# Patient Record
Sex: Female | Born: 1965
Health system: Southern US, Community
[De-identification: ages and names within clinical notes are randomized; demographics above are authoritative.]

## PROBLEM LIST (undated history)

## (undated) DIAGNOSIS — K219 Gastro-esophageal reflux disease without esophagitis: Secondary | ICD-10-CM

## (undated) DIAGNOSIS — T7840XA Allergy, unspecified, initial encounter: Secondary | ICD-10-CM

## (undated) DIAGNOSIS — E119 Type 2 diabetes mellitus without complications: Secondary | ICD-10-CM

## (undated) DIAGNOSIS — IMO0002 Reserved for concepts with insufficient information to code with codable children: Secondary | ICD-10-CM

## (undated) DIAGNOSIS — M1990 Unspecified osteoarthritis, unspecified site: Secondary | ICD-10-CM

## (undated) DIAGNOSIS — S92909A Unspecified fracture of unspecified foot, initial encounter for closed fracture: Secondary | ICD-10-CM

## (undated) DIAGNOSIS — F419 Anxiety disorder, unspecified: Secondary | ICD-10-CM

## (undated) DIAGNOSIS — M549 Dorsalgia, unspecified: Secondary | ICD-10-CM

## (undated) HISTORY — PX: CERVICAL CERCLAGE: SHX1329

## (undated) HISTORY — DX: Type 2 diabetes mellitus without complications: E11.9

## (undated) HISTORY — DX: Unspecified osteoarthritis, unspecified site: M19.90

## (undated) HISTORY — DX: Allergy, unspecified, initial encounter: T78.40XA

## (undated) HISTORY — PX: UPPER GASTROINTESTINAL ENDOSCOPY: SHX188

## (undated) HISTORY — DX: Dorsalgia, unspecified: M54.9

## (undated) HISTORY — DX: Reserved for concepts with insufficient information to code with codable children: IMO0002

## (undated) HISTORY — DX: Anxiety disorder, unspecified: F41.9

## (undated) HISTORY — DX: Unspecified fracture of unspecified foot, initial encounter for closed fracture: S92.909A

## (undated) HISTORY — DX: Gastro-esophageal reflux disease without esophagitis: K21.9

---

## 1999-10-13 ENCOUNTER — Inpatient Hospital Stay (HOSPITAL_COMMUNITY): Admission: AD | Admit: 1999-10-13 | Discharge: 1999-10-13 | Payer: Self-pay | Admitting: *Deleted

## 2000-01-05 ENCOUNTER — Inpatient Hospital Stay (HOSPITAL_COMMUNITY): Admission: AD | Admit: 2000-01-05 | Discharge: 2000-01-05 | Payer: Self-pay | Admitting: *Deleted

## 2000-09-28 ENCOUNTER — Other Ambulatory Visit: Admission: RE | Admit: 2000-09-28 | Discharge: 2000-09-28 | Payer: Self-pay | Admitting: Obstetrics and Gynecology

## 2000-11-23 ENCOUNTER — Ambulatory Visit (HOSPITAL_COMMUNITY): Admission: RE | Admit: 2000-11-23 | Discharge: 2000-11-23 | Payer: Self-pay | Admitting: Obstetrics and Gynecology

## 2000-11-23 ENCOUNTER — Encounter: Payer: Self-pay | Admitting: Obstetrics and Gynecology

## 2000-12-19 ENCOUNTER — Inpatient Hospital Stay (HOSPITAL_COMMUNITY): Admission: AD | Admit: 2000-12-19 | Discharge: 2000-12-21 | Payer: Self-pay | Admitting: Obstetrics and Gynecology

## 2000-12-25 ENCOUNTER — Inpatient Hospital Stay (HOSPITAL_COMMUNITY): Admission: AD | Admit: 2000-12-25 | Discharge: 2000-12-30 | Payer: Self-pay | Admitting: Obstetrics and Gynecology

## 2000-12-25 ENCOUNTER — Encounter (INDEPENDENT_AMBULATORY_CARE_PROVIDER_SITE_OTHER): Payer: Self-pay

## 2000-12-25 ENCOUNTER — Encounter: Payer: Self-pay | Admitting: Obstetrics and Gynecology

## 2001-08-16 ENCOUNTER — Other Ambulatory Visit: Admission: RE | Admit: 2001-08-16 | Discharge: 2001-08-16 | Payer: Self-pay | Admitting: Obstetrics and Gynecology

## 2001-08-30 ENCOUNTER — Ambulatory Visit (HOSPITAL_COMMUNITY): Admission: RE | Admit: 2001-08-30 | Discharge: 2001-08-30 | Payer: Self-pay | Admitting: Obstetrics and Gynecology

## 2001-09-17 ENCOUNTER — Encounter (INDEPENDENT_AMBULATORY_CARE_PROVIDER_SITE_OTHER): Payer: Self-pay

## 2001-09-17 ENCOUNTER — Encounter: Payer: Self-pay | Admitting: Obstetrics and Gynecology

## 2001-09-17 ENCOUNTER — Inpatient Hospital Stay (HOSPITAL_COMMUNITY): Admission: RE | Admit: 2001-09-17 | Discharge: 2001-09-19 | Payer: Self-pay | Admitting: Obstetrics and Gynecology

## 2004-06-22 ENCOUNTER — Ambulatory Visit (HOSPITAL_COMMUNITY): Admission: RE | Admit: 2004-06-22 | Discharge: 2004-06-22 | Payer: Self-pay | Admitting: Obstetrics and Gynecology

## 2004-07-05 ENCOUNTER — Other Ambulatory Visit: Admission: RE | Admit: 2004-07-05 | Discharge: 2004-07-05 | Payer: Self-pay | Admitting: Obstetrics and Gynecology

## 2004-07-07 ENCOUNTER — Observation Stay (HOSPITAL_COMMUNITY): Admission: AD | Admit: 2004-07-07 | Discharge: 2004-07-08 | Payer: Self-pay | Admitting: Obstetrics and Gynecology

## 2004-07-26 ENCOUNTER — Inpatient Hospital Stay (HOSPITAL_COMMUNITY): Admission: AD | Admit: 2004-07-26 | Discharge: 2004-07-26 | Payer: Self-pay | Admitting: Obstetrics and Gynecology

## 2004-11-09 ENCOUNTER — Inpatient Hospital Stay (HOSPITAL_COMMUNITY): Admission: AD | Admit: 2004-11-09 | Discharge: 2004-11-11 | Payer: Self-pay | Admitting: Obstetrics and Gynecology

## 2004-11-09 ENCOUNTER — Encounter (INDEPENDENT_AMBULATORY_CARE_PROVIDER_SITE_OTHER): Payer: Self-pay | Admitting: Specialist

## 2005-11-06 ENCOUNTER — Emergency Department (HOSPITAL_COMMUNITY): Admission: EM | Admit: 2005-11-06 | Discharge: 2005-11-06 | Payer: Self-pay | Admitting: Emergency Medicine

## 2005-12-05 ENCOUNTER — Other Ambulatory Visit: Admission: RE | Admit: 2005-12-05 | Discharge: 2005-12-05 | Payer: Self-pay | Admitting: Obstetrics and Gynecology

## 2007-11-27 ENCOUNTER — Emergency Department (HOSPITAL_COMMUNITY): Admission: EM | Admit: 2007-11-27 | Discharge: 2007-11-27 | Payer: Self-pay | Admitting: Family Medicine

## 2008-08-03 ENCOUNTER — Emergency Department (HOSPITAL_COMMUNITY): Admission: EM | Admit: 2008-08-03 | Discharge: 2008-08-03 | Payer: Self-pay | Admitting: Family Medicine

## 2009-01-08 ENCOUNTER — Emergency Department (HOSPITAL_COMMUNITY): Admission: EM | Admit: 2009-01-08 | Discharge: 2009-01-08 | Payer: Self-pay | Admitting: Emergency Medicine

## 2009-01-12 ENCOUNTER — Ambulatory Visit (HOSPITAL_COMMUNITY): Admission: RE | Admit: 2009-01-12 | Discharge: 2009-01-12 | Payer: Self-pay | Admitting: Obstetrics

## 2009-05-06 ENCOUNTER — Emergency Department (HOSPITAL_COMMUNITY): Admission: EM | Admit: 2009-05-06 | Discharge: 2009-05-06 | Payer: Self-pay | Admitting: Emergency Medicine

## 2009-06-03 ENCOUNTER — Encounter: Admission: RE | Admit: 2009-06-03 | Discharge: 2009-06-03 | Payer: Self-pay | Admitting: Internal Medicine

## 2010-01-14 ENCOUNTER — Emergency Department (HOSPITAL_COMMUNITY): Admission: EM | Admit: 2010-01-14 | Discharge: 2010-01-14 | Payer: Self-pay | Admitting: Emergency Medicine

## 2010-03-03 ENCOUNTER — Ambulatory Visit (HOSPITAL_COMMUNITY): Admission: RE | Admit: 2010-03-03 | Discharge: 2010-03-03 | Payer: Self-pay | Admitting: Obstetrics

## 2010-12-15 ENCOUNTER — Emergency Department (HOSPITAL_COMMUNITY)
Admission: EM | Admit: 2010-12-15 | Discharge: 2010-12-15 | Payer: Self-pay | Source: Home / Self Care | Admitting: Emergency Medicine

## 2010-12-15 ENCOUNTER — Emergency Department (HOSPITAL_COMMUNITY)
Admission: EM | Admit: 2010-12-15 | Discharge: 2010-12-15 | Payer: Self-pay | Source: Home / Self Care | Admitting: Family Medicine

## 2011-04-21 NOTE — H&P (Signed)
[facility]  Patient:    Amy [name], Amy [name] Visit Number: [id number] MRN: [id number]          Service Type: Attending:  Alvino Chapel, M.D. Dictated by:   Alvino Chapel, M.D. Adm. Date:  08/30/01                           History and Physical  HISTORY OF PRESENT ILLNESS:   The patient is a 45 year old G5, P2-1-1-2, who is coming in at 15-2/[redacted] weeks gestation with a history of incompetent cervix for a cervical cerclage to be placed electively early in pregnancy.  Pregnancy otherwise is complicated by sickle cell trait in the father of the baby and reflux disease, which is being treated with Pepcid.  PRENATAL LABS:                A positive, antibody negative, sickle negative, RPR nonreactive, rubella immune, hepatitis B surface antigen negative, HIV negative, GC negative, Chlamydia negative.  PAST OBSTETRICAL HISTORY:     In 1988, the patient had a term pregnancy at 7 pounds 1 ounce with bed rest and terbutaline required.  In 1990, she had a term delivery at 7 pounds 6 ounces with no prenatal care.  In 1995, she had an elective abortion.  In January 2002, the patient had a 23-weeks gestation which delivered vaginally, 1 pound 8.5 ounces, and expired after two days of life.  PAST GYNECOLOGICAL HISTORY:   None significant.  PAST MEDICAL HISTORY:         History of reflux disease and hiatal hernia.  PAST SURGICAL HISTORY:        None.  PHYSICAL EXAMINATION:  VITAL SIGNS:                  The patient is afebrile, with stable vital signs on admission.  Weight is 200 pounds even.  Blood pressure 118/80.  ABDOMEN:                      Fundal height is consistent with [redacted] weeks gestation.  Fetal heart rate was approximately 140 prior to procedure.  CERVIX:                       The cervix was long and closed to palpation.  PLAN:  The patient was counseled as to the risks and benefits of the procedure, including bleeding, infection, and  possible rupture of membrane, however, given her previous history, it is felt that this is a necessary course to avoid preterm delivery.  She understands this and wishes to proceed with surgery. Dictated by:   Alvino Chapel, M.D. Attending:  Alvino Chapel, M.D. DD:  08/29/01 TD:  08/29/01 Job: [id number]

## 2011-04-21 NOTE — Discharge Summary (Signed)
[facility]  Patient:    Amy [name], Amy [name]                     MRN: [id number] Adm. Date:  [id number] Disc. Date: 12/30/00 Attending:  Michaele Offer                           Discharge Summary  HISTORY OF PRESENT ILLNESS:   [age]-year-old black female para 2-0-1-2, gravida 4, [facility] Apr 24, 2001 by ultrasound admitted with a cervix 8 cm dilated and bloody vaginal discharge.  Vaginal ultrasound on September 17, 2000. Crown-rump length 2.0 cm, 8 weeks 5 days, [facility] Apr 24, 2001.  Blood group and type A positive with a negative antibody.  Sickle cell negative.  VDRL negative.  Rubella immune.  Hepatitis B surface antigen negative.  HIV negative.  GC and Chlamydia negative.  Triple screen normal.  The patient was admitted on December 19, 2000 with cervical dilatation.  She was placed on magnesium sulfate and did well.  She was discharged December 21, 2000.  On December 24, 2000, the patient noted some contractions and bloody discharge. She came to our office on day of admission and the cervix was 8 cm dilated. Membranes were intact even though the fern was slightly positive.  PAST MEDICAL HISTORY:         No known drug allergies.  No operations.  Usual childhood diseases.  She does have a hiatus hernia and gastroesophageal reflux disorder.  FAMILY HISTORY:               Mother has diabetes.  SOCIAL HISTORY:               Alcohol, tobacco, and drugs none.  OBSTETRIC HISTORY:            In March 1988, the patient took terbutaline during the pregnancy and delivered a 7 pounds 1 ounce female.  In February 1990, she had a 7-pound-6-ounce female, no problems.  Her labors were 1 and 1-1/2 hours, respectively.  In 1995, she had an early abortion.  PHYSICAL EXAMINATION:         Blood pressure was 120/72, pulse was 84, temperature was 99.  The abdomen was soft.  Fundal height 24 cm.  Fetal heart tones were normal.  Speculum exam by Dr. Jackelyn Knife showed a  bloody purulent discharge, (+/-) fern, cervix 8 cm with bulging bag of waters.  Ultrasound showed a vertex presentation.  PIH labs were normal.  IMPRESSION:                   1. Intrauterine pregnancy at 23 weeks.                               2. Premature cervical dilatation.  HOSPITAL COURSE:              The patient was placed on IV Unasyn and magnesium sulfate, bed rest in the Trendelenburg position, and she was given intramuscular betamethasone.  She and her partner wanted to try everything to save the baby.  I spoke to the neonatal physician, Dr. Mikle Bosworth and she recommended proceeding with the betamethasone.  On December 26, 2000, the patient complained of increased pressure during the night but exam confirmed that the cervix was 8 cm.  A Foley catheter was placed.  On December 27, 2000, the patient  complained that she felt a little bit wet in her vulvar area. Vital signs were okay.  The patient was instructed by Dr. Senaida Ores about the implications of a 23- to 24-week delivery in terms of fetal status.  Patient desired all interventions for the fetus including C-section for breech.  On December 28, 2000, the patient was leaking clear fluid but the bag of waters was bulging.  She subsequently on December 28, 2000 had bloody material from her vagina and she also began to have regular cramps, the cervix was fully dilated and the vertex was at +1 station.  Magnesium sulfate was discontinued. Artificial rupture of the membranes produced yellow-clear fluid.  She delivered spontaneously vertex over an intact perineum by Dr. Ambrose Mantle a living female infant 1 pound 8-1/2 ounces, 691 g, with Apgars of 1 at one minute, 3 at five minutes, and 5 at ten minutes.  Dr. Alison Murray was present for the delivery. The placenta had a formed blood clot on one margin.  Placenta was removed. Some membranes were removed with the ring forceps.  Blood loss was about 100 cc.  Postpartum, the patient did well except for a  low-grade temperature. She was treated with Augmentin postpartum.  The babys condition is quite guarded and the patient is discharged on the second postpartum day.  Initial hemoglobin 12.6, hematocrit 36.8, white count 10,400, platelet count 227,000. Followup hemoglobin 12.5, hematocrit 37.2, white count 12,200.  RPR was nonreactive.  Ultrasound report done while the patient was in the hospital showed a mean gestational age of [redacted] weeks and 2 days.  Estimated fetal weight was at the 77th percentile for a 23-week gestation.  There was no measurable cervix length and the cervix was thought to be approximately 4 cm dilated. The membranes could be seen bulging into the cervix.  No free fluid was seen in the cervical canal and amniotic fluid was appropriate.  FINAL DIAGNOSES:              1. Intrauterine pregnancy 23+ weeks,                                  delivered vertex.                               2. Premature labor versus incompetent cervix.                               3. Partial abruption of the placenta.                               4. Chorioamnionitis.  OPERATION:                    Spontaneous delivery vertex.  FINAL CONDITION:              Improved.  INSTRUCTIONS:                 Regular diet.  No vaginal entrance.  No heavy lifting or strenuous activity.  Report any fever above 100.4 degrees.  Report any extremely heavy bleeding.  Report any unusual problems.  FOLLOWUP:                     The patient is  advised to return to the office in four weeks for followup examination. DD:  12/30/00 TD:  12/30/00 Job: [id number]

## 2011-04-21 NOTE — Discharge Summary (Signed)
[name], [name]                        ACCOUNT NO.:  [id number]   MEDICAL RECORD NO.:  [id number]                   PATIENT TYPE:  OBV   LOCATION:  9374                                 FACILITY:  [facility]   PHYSICIAN:  Huel Cote, M.D.              DATE OF BIRTH:  1966/10/18   DATE OF ADMISSION:  07/07/2004  DATE OF DISCHARGE:  07/08/2004                                 DISCHARGE SUMMARY   DISCHARGE DIAGNOSES:  1. Preterm pregnancy at 20+ weeks.  2. Incompetent cervix.  3. Prior second trimester losses x2.  4. Status post McDonald cerclage placement x2.   DISCHARGE MEDICATIONS:  1. Augmentin 500 mg p.o. b.i.d. x7 days.  2. Flagyl 500 mg p.o. twice daily for an additional 4 days.   DISCHARGE FOLLOW-UP:  The patient is to follow up in the office in  approximately 1 week for cervical evaluation by ultrasound and exam.  The  patient will be on bedrest and pelvic rest with bathroom privileges only at  home.   HOSPITAL COURSE:  The patient is a 45 year old G6 P2-1-2-2 who was admitted  to undergo cervical cerclage placement for history of incompetent cervix and  findings of cervical shortening and dilation on her recent ultrasound.  The  patient first presented for prenatal care late at 18 weeks; therefore, her  dating is by an 18-week ultrasound.  She was admitted and underwent a  McDonald cerclage placement x2 without difficulty.  At the conclusion of the  procedure the cervix was approximately 2 cm long and was closed with two  sutures in place.  She was admitted for overnight observation and had an  uneventful course.  She had no bleeding or leakage of fluid and good fetal  movement.  She was continued on her Unasyn as she has tested positive in her  group B strep status prior to and with this pregnancy, and was discharged  home on Augmentin.  The patient will continue bedrest and pelvic rest and  follow up within the week in the office.                   Huel Cote, M.D.    KR/MEDQ  D:  07/31/2004  T:  08/01/2004  Job:  [id number]

## 2011-04-21 NOTE — H&P (Signed)
Amy [name], Amy [name]                        ACCOUNT NO.:  [id number]   MEDICAL RECORD NO.:  [id number]                   PATIENT TYPE:  AMB   LOCATION:  SDC                                  FACILITY:  [facility]   PHYSICIAN:  Huel Cote, M.D.              DATE OF BIRTH:  09/23/1966   DATE OF ADMISSION:  07/07/2004  DATE OF DISCHARGE:                                HISTORY & PHYSICAL   The patient is a 45 year old G6, P2-1-2-2, who is admitted to undergo a  cervical cerclage placement for a history of incompetent cervix and a  finding of cervical shortening on her most recent ultrasound to 1.9 cm with  some funneling.  The patient first presented for prenatal care at  approximately 18 weeks' gestation when she called to report that she had  missed her period and had a positive pregnancy test, however was already  feeling fetal movement.  She was seen on June 23, 2004, by Malachi Pro. Ambrose Mantle,  M.D., who evaluated her and felt that her cervix felt long and closed, and  ultrasound performed at that time indeed showed the cervix to be 3.5 cm  long.  The patient was given all options, including expectant management  with p.o. antibiotic therapy as well as a cerclage placement and close  monitoring.  The patient at that time declined any cerclage and was  scheduled for follow-up within two weeks.  When she followed up again on  July 05, 2004, her cervical length was repeated and found to be diminished  to 1.9 cm.  On exam the cervix felt closed at the internal os with external  os open and the patient was again counseled for a high risk of second  trimester miscarriage.  The patient had been placed prophylactically on  penicillin for a positive group B strep and given Flagyl for a bacterial  vaginosis infection.  Prenatal labs have been performed and are pending.   PAST MEDICAL HISTORY:  Reflux disease.   PAST SURGICAL HISTORY:  History of an elective abortion and history of a  cerclage  placement with her last pregnancy.   PAST OBSTETRICAL HISTORY:  In 1988 the patient had term vaginal delivery of  a 7 pound 1 ounce female infant.  She did have preterm labor and bed rest  with that pregnancy.  In 1990 she had a 7 pound 6 ounce infant at term with  no prenatal care.  In 1995 she had an elective abortion.  In 2002 she had a  delivery of a 23-week fetus, 1 pound 8 ounces, which expired after 2 days of  age.  In 2002 later that same year the patient became pregnant and had a  cerclage placed at approximately 15 weeks.  She then returned to the  hospital in approximately two weeks with leakage of fluid consistent with  PROM and severe chorioamnionitis and went on to deliver.   PAST  GYNECOLOGIC HISTORY:  History as stated previously, consistent with a  probable incompetent cervix.   PHYSICAL EXAMINATION:  VITAL SIGNS:  The patient's blood pressure is 115/60,  weight is 204 pounds.  Fundal height is at the umbilicus consistent with a  20-week gestation.  ABDOMEN:  Soft and nontender.  CARDIAC:  Regular rate and rhythm.  CHEST:  Lungs are clear.  PELVIC:  Cervical exam, as stated, is long and closed at the internal os.  Her pelvic exam is also positive on wet prep for a bacterial vaginosis  infection, for which she has been placed on Flagyl.  MONITORING:  Fetal heart rate is good at approximately 150.  The patient is  having ultrasound with normal fetal anatomy noted except for a fetal  echogenic intracardiac focus.  However, no other findings were of note.   The patient was counseled on the risks and benefits of cervical cerclage,  including bleeding, infection, and possible range of motion.  The patient  understands these risks and desires to proceed with surgery as stated.  She  understands that without a cerclage she has a poor prognosis for carrying  her pregnancy to term and though there is some increased risk of infection  with cerclage is willing to take that chance  in order to have a viable  pregnancy.                                               Huel Cote, M.D.    KR/MEDQ  D:  07/06/2004  T:  07/06/2004  Job:  [id number]

## 2011-04-21 NOTE — Discharge Summary (Signed)
[place]  Patient:    Amy [name], Amy [name] Visit Number: [id number] MRN: [id number]          Service Type: MED Location: [phone] [id number] Attending Physician:  Malon Kindle Admit Date:  09/17/2001 Discharge Date: 09/19/2001                             Discharge Summary  HISTORY OF PRESENT ILLNESS:   A 45 year old black married female, para 2-1-1-2, gravida 5, last menstrual period May 19, 2001, [facility] February 23, 2002, by dates and February 17, 2002, by ultrasound, admitted with ultrasound findings showing no cervical length and the fetal head bulging into the vagina.  Blood group and type A positive with a negative antibody, sickle cell negative, VDRL negative, hepatitis B surface antigen negative, HIV negative, GC and Chlamydia negative, triple screen normal.  Vaginal ultrasound on July 22, 2001; crown-rump length 3.1 cm, 10 weeks 0 days, estimated date of confinement February 17, 2002.  HOSPITAL COURSE:              The patient underwent cervical cerclage on August 30, 2001, by Dr. Senaida Ores.  She was seen on September 13, 2001, by Dr. Senaida Ores and suture was intact.  Ultrasound on the day of admission showed no cervical length and the fetal head was bulging into the vagina.  The patient was admitted and gave a history of leaking fluid since September 16, 2001.  Temperature was 100.1 in maternity admissions unit and she began to have chills.  PAST MEDICAL HISTORY:          Usual childhood diseases and gastroesophageal reflux disease.  ALLERGIES:                    No known drug allergies.  PAST SURGICAL HISTORY:        None.  ALCOHOL, TOBACCO, DRUGS:      None.  MEDICATIONS:                  Pepcid and prenatal vitamins.  PAST OBSTETRIC HISTORY:       In 1988 a 7 pound 1 ounce female vaginally. 1990 7 pound 6 ounce female vaginally.  1995 early abortion.  2002 1 pound 8-1/2 ounce female at 23 weeks delivered vaginally and expired at two  days.  PHYSICAL EXAMINATION:         VITAL SIGNS: Temperature 100.1, pulse 100, respirations 18, blood pressure 134/67.  HEENT: Normal.  HEART: Normal rhythm, no murmurs.  Pulse 108.  LUNGS: Clear to P&A.  ABDOMEN: Soft.  Uterus was tender.  PELVIC: The vulva was wet.  The vagina showed rupture of membranes with purulent fluid in the vagina.  Cervix; suture was present and appeared to be pulled through on the right.  I could not actually see the left side of the cervix.  I did remove the suture.  The cervix was 3 cm, about 70%, and presenting part was at -2 station.  Ultrasound had shown vertex presentation.  IMPRESSION:                   1. Intrauterine pregnancy at 18 weeks.                               2. Incompetent cervix.  3. Chorioamnionitis.                               4. Premature preterm rupture of membranes.  HOSPITAL COURSE:              The patient was placed on IV Unasyn and IV Pitocin.  By 6:15 p.m. she was contracting regularly.  The cervix was 5 cm, 90%, presenting part was at 0 station.  There was moderate dark blood present. The patient passed the fetus at 8:44 p.m.  The placenta did not follow.  At approximately 10 p.m. I placed the patient in stirrups and with ring forceps, extracted a large part of the placenta intact, after which digital examination revealed fragments of the placenta remained.  I used ring forceps to remove many additional fragments until the inside of the uterus felt empty.  The procedure was complicated by poor exposure of the left side of the cervix. Estimated blood loss was probably prior to the time I manipulated the placenta about 500 cc, based on the nursing estimates of prior blood clots.  There was almost no bleeding during the procedure that I did.  Postpartum, the patient did well.  She did elevate her temperature to 103.1, but remained afebrile thereafter.  She had minimal bleeding and on September 19, 2001, was felt to be ready for discharge.  I did do a Group B Strep culture from the vaginal contents at the time I examined her antepartum, however, it is possible that it was canceled for some reason I do not understand.  The hemoglobin on the first postpartum day was 9.7, hematocrit 28.1, white count 16,000, platelet count 194,000.  Comprehensive metabolic profile was normal.  Urinalysis was negative except for 100 mg% protein.  Urine culture showed no growth in one day.  FINAL DIAGNOSES:              1. Intrauterine pregnancy at 18 weeks.                               2. Incompetent cervix.                               3. Chorioamnionitis.  PROCEDURE:                    Removal of cerclage suture, instrumental removal of the placenta.  CONDITION ON DISCHARGE:       Improved.  DISCHARGE INSTRUCTIONS:       Regular diet.  No vaginal entrance.  No heavy lifting or strenuous activit.  Report any temperature greater than 100.4 degrees.  Report any heavy vaginal bleeding.  Augmentin 500 mg p.o. q.8h. for four days.  Return to the office in two weeks for follow-up examination. Attending Physician:  Malon Kindle DD:  09/19/01 TD:  09/20/01 Job: [id number]

## 2011-04-21 NOTE — Discharge Summary (Signed)
[facility]  Patient:    Amy [name], Amy [name]                     MRN: [id number] Adm. Date:  [id number] Disc. Date: [id number] Attending:  Michaele Offer                           Discharge Summary  ADMISSION DIAGNOSES:          Intrauterine pregnancy at 22 weeks, preterm cervical dilation.  DISCHARGE DIAGNOSES:          Intrauterine pregnancy at 22 weeks, preterm labor.  PROCEDURE:                    Intravenous tocolysis.  COMPLICATIONS:                None.  CONSULTATIONS:                None.  HISTORY AND PHYSICAL:         This is a 45 year old gravida 4, para 2-0-1-2 with an EGA of [redacted] weeks with an EDC of Apr 24, 2001 by an 8 week ultrasound who presents for admission after she was found to be dilated in the office to 1+ cm.  The patient complained of uterine contractions the past several days, but did not call the office with these contractions.  On the day of admission she has not felt contractions, no leakage of fluid, no vaginal bleeding, and good fetal movement.  Prenatal care complicated by a history of preterm labor with her last two pregnancies, but did not deliver them early.  PRENATAL LABORATORIES:        Blood type A+.  Negative antibody screen. Rubella immune.  Sickle screen negative.  Hepatitis B surface antigen negative.  HIV negative.  Gonorrhea and chlamydia negative.  PAST OBSTETRICAL HISTORY:     In 1988 vaginal delivery at term 7 pounds 1 ounce.  She did receive terbutaline for dilating early.  In 1990 vaginal delivery at term 7 pounds 6 ounces with no prenatal care.  In 1995 she had an elective termination.  PAST MEDICAL HISTORY:         Gastroesophageal reflux disease.  SOCIAL HISTORY:               Patient is single and the father of the baby is involved and this is his first child.  PHYSICAL EXAMINATION:  VITAL SIGNS:                  Afebrile with stable vital signs.  Fetal heart tones were present and on  the monitor she initially had no contractions.  ABDOMEN:                      Gravid and nontender.  PELVIC:                       Cervix was 1+, 80% effaced with a palpable bag of waters and no fetal parts were palpable.  HOSPITAL COURSE:              An extensive discussion was had with the patient and the father of the baby regarding the situation.  She was given the option of conservative treatment with bed rest or starting with bed rest and attempting to place a cerclage in a couple of days.  The father  of the baby appeared to be very apprehensive about this option.  She was initially admitted for observation, treated with IV Unasyn for broad coverage.  On January 17 Dr. Ambrose Mantle saw the patient and she reported contractions, but they were not showing up on the monitor.  He performed a cervical examination at that point and she was 2 cm dilated and uncertain effacement because of a soft cervix and the presenting part was in the lowest part of a thin lower uterine segment.  She was then started on magnesium sulfate for tocolysis.  This rapidly quiesced her contractions and she was unable to appreciate any more contractions.  On the morning of January 18 she was again feeling no contractions and none were showing up on the monitor.  The patient stated at that point that she would not want a cerclage due to the risks even if we did feel she had an incompetent cervix.  Due to the fact that she was no longer having contractions, her magnesium sulfate was discontinued and the plan was to observer her throughout the day and I was going to reevaluate her that evening to see if she was a candidate for discharge.  At this point her Unasyn was also discontinued.  The nurse called me later that afternoon and stated the patient was requesting discharge immediately.  She was having no contractions and no complaints and I felt that she was stable for discharge home.  CONDITION ON DISCHARGE:        Stable.  DISPOSITION:                  Discharged to home.  DIET:                         Regular.  ACTIVITY:                     Modified bed rest.  FOLLOW-UP:                    One week.  DISCHARGE MEDICATIONS:        None.DD:  01/22/01 TD:  01/23/01 Job: [id number]

## 2011-04-21 NOTE — Op Note (Signed)
[facility]  Patient:    Amy [name], Amy [name] Visit Number: [id number] MRN: [id number]          Service Type: DSU Location: [facility] Attending Physician:  Oliver Pila Dictated by:   Alvino Chapel, M.D. Admit Date:  08/30/2001                             Operative Report  PREOPERATIVE DIAGNOSES:       1. Incompetent cervix.                               2. Intrauterine pregnancy at 15 plus weeks.  POSTOPERATIVE DIAGNOSES:      1. Incompetent cervix.                               2. Intrauterine pregnancy at 15 plus weeks.  OPERATION:                    McDonald cerclage.  SURGEON:                      Alvino Chapel, M.D.  ASSISTANT:                    Malachi Pro. Ambrose Mantle, M.D.  ANESTHESIA:                   Spinal.  ESTIMATED BLOOD LOSS:         50 cc.  URINE OUTPUT:                 150 cc, clear.  IV FLUIDS:                    800 cc of LR.  FINDINGS:                     Fetal heart tones were present before and after _______ at about 150.  DESCRIPTION OF PROCEDURE:     The patient was taken to the operating room where spinal anesthesia was obtained without difficulty.  She was then prepped and draped in the normal sterile fashion in the dorsolithotomy position.  A long weighted speculum was then placed within the vagina, and the cervix identified and grasped with a ring forceps on the anterior lip.  This was retracted downwards and a #2 Polydek suture on a Mayo needle was then was then utilized to inspect a McDonald cerclage stitching counter clockwise in several bites.  This was carried around the entire circumference of the cervix, and at the conclusion of the procedure, the suture was cinched down with good result. There was minimal bleeding noted at this point, and therefore all instruments and sponges were removed from the vagina, and the patient was taken to the recovery room in stable condition.  The knot on the  suture is tied at approximately 12 oclock. Dictated by:   Alvino Chapel, M.D. Attending Physician:  Oliver Pila DD:  08/30/01 TD:  08/30/01 Job: [phone] [id number]

## 2011-04-21 NOTE — Op Note (Signed)
Amy [name], Amy [name]                        ACCOUNT NO.:  [id number]   MEDICAL RECORD NO.:  [id number]                   PATIENT TYPE:  OBV   LOCATION:  9374                                 FACILITY:  [facility]   PHYSICIAN:  Huel Cote, M.D.              DATE OF BIRTH:  05/01/1966   DATE OF PROCEDURE:  07/07/2004  DATE OF DISCHARGE:                                 OPERATIVE REPORT   PREOPERATIVE DIAGNOSES:  1. Pregnancy at 20+ weeks.  2. Incompetent cervix.  3. Prior second trimester loss times two.   POSTOPERATIVE DIAGNOSES:  1. Pregnancy at 20+ weeks.  2. Incompetent cervix.  3. Prior second trimester loss times two.   OPERATION PERFORMED:  McDonalds cerclage times two with Polydac suture.   SURGEON:  Huel Cote, M.D.   ANESTHESIA:  Spinal.   ESTIMATED BLOOD LOSS:  100 mL.   URINE OUTPUT:  100 mL straight cath after procedure.   IV FLUIDS:  1200 mL lactated Ringers.   FINDINGS:  The cervix was approximately 2 cm long, internal os was  fingertip, open prior to procedure and closed after the procedure.   DESCRIPTION OF PROCEDURE:  The patient was taken to the operating room where  spinal anesthesia was obtained without difficulty.  She was then prepped and  draped in the normal sterile fashion in the dorsal lithotomy position.  A  weighted speculum was then placed into the vagina and the cervix identified  and the anterior lip grasped with a ring forcep.  The juncture between the  cervix and the vaginal mucosa was noted and a Polydac suture was placed  beginning at 12 o'clock in the circumferential fashion just at the border  between the vagina and the cervix reflection.  This was carried around as  stated circumferentially and tied at 12 o'clock.  With this suture secure,  there was no opening in the internal os.  It was completely closed. The  second suture was then placed just behind the first suture in a similar  fashion starting at 12 o'clock and going  counterclockwise in a  circumferential fashion and tied at 12 o'clock also.  At the conclusion of  the procedure there was no active bleeding noted, no leakage of fluid was  noted and therefore, all instruments and sponges were removed from the  vagina.  The bladder was emptied and the patient was taken to the recovery  room in stable condition.  She will be admitted to observation overnight and  kept on IV antibiotics and discharged to home on p.o. antibiotics as well.                                               Huel Cote, M.D.    KR/MEDQ  D:  07/07/2004  T:  07/08/2004  Job:  [id number]

## 2011-04-21 NOTE — Discharge Summary (Signed)
Amy [name], Amy [name]              ACCOUNT NO.:  [id number]   MEDICAL RECORD NO.:  [id number]          PATIENT TYPE:  INP   LOCATION:  9146                          FACILITY:  [facility]   PHYSICIAN:  Huel Cote, M.D. DATE OF BIRTH:  [birth year]   DATE OF ADMISSION:  11/09/2004  DATE OF DISCHARGE:  11/11/2004                                 DISCHARGE SUMMARY   DISCHARGE DIAGNOSES:  1.  Term pregnancy at 38 weeks, delivered.  2.  Incompetent cervix with cerclage in the pregnancy.  3.  Positive group B strep status.  4.  Status post vaginal delivery with shoulder dystocia.  5.  Status post retained placenta necessitating postpartum dilatation and      curettage.   DISCHARGE MEDICATIONS:  Motrin 600 mg p.o. every six hours.   DISCHARGE FOLLOWUP:  The patient is to follow up in my office in  approximately six weeks for her postpartum exam.   HOSPITAL COURSE:  The patient is a 45 year old G6 P2-1-2-2 who is admitted  at 42 plus weeks for induction of labor given advanced dilation and history  of rapid labors.  The pregnancy has been complicated by a history of  incompetent cervix with previous losses.  She had a cerclage placed and was  treated with clindamycin and penicillin alternating for positive group B  strep status and her poor obstetrical history.  She had a cerclage placed at  approximately 20 weeks and this was removed at 36 weeks without problem.  She was also on bed rest for her entire pregnancy.   PRENATAL LABORATORIES:  Are as follows:  A+, Sickle negative, RPR negative,  Rubella immune, hepatitis B surface antigen negative, HIV negative, GC  negative, Chlamydia negative, triple screen normal.  One hour Glucola 131,  group B strep positive.   PAST OBSTETRIC HISTORY:  In 1988, she had a vaginal delivery of a 7 pound 1  ounce infant.  In 1990, she had a vaginal delivery of a 7 pound 6 ounce  infant and in 1995, she had an elective abortion.  In 2002, she had an 18  week  loss.  In 2002, she had an 18 week loss despite a cerclage.   PAST GYNECOLOGIC HISTORY:  She had no abnormal Pap smears but a history of  an incompetent cervix.   PAST MEDICAL HISTORY:  Reflux disease.   PAST SURGICAL HISTORY:  She had D&C's with both of her losses.   ALLERGIES:  Flagyl.   PHYSICAL EXAMINATION:  VITAL SIGNS:  On admission, she was afebrile with  stable vital signs.  Fetal heart rate was reactive.  Cervical exam was  complete, 4 cm and -1 station.   She had rupture of membranes performed and was placed on Pitocin.  She  progressed throughout the day, reached complete dilation and pushed well  with the baby beginning in an LOP presentation and spontaneously rotating to  ROA with pushing.  She was able to push out the head and there was a nuchal  cord x1 which was reduced.  A moderate shoulder dystocia was then  encountered with the right  shoulder anterior.  Delivery was effective with  McRoberts' suprapubic pressure and a corkscrew maneuver.  The baby did well  after delivery.  Apgar's were 7 and 8, weight was 9 pounds 13 ounces.  Placenta was noted to be adherent after delivery and attempted manual  removal but the patient was unable to tolerate this as she had not received  an epidural during labor.  The bulk of the placenta was removed but retained  fragments were palpated.  The decision was made then to take the patient to  the OR for sedation to confirm removal of the placental fragments.  There  was also some uterine atne which lead to higher blood loss of approximately  600 cc prior to going to the OR.  In the OR, there was partial accreta noted  with several small fragments palpable in the uterus.  These were removed  with manual inspection after the patient was adequately sedated.  Several  small fragments and membranes were removed and there was no other bulky  placental tissue left within the uterine cavity.  A total blood loss for  delivery and the OR was  approximately 1000 cc.  Uterus was boggy and  responded to Methergine and Pitocin.  The patient was then admitted for  routine postpartum care and did very well.  Her postoperative hemoglobin  went from 14.3 down to 9.7.  She was able to ambulate and had no problems  with this blood loss.  She was afebrile with stable vital signs. On day of  discharge, the baby had required a short stay in the NICU for some  hypoglycemia however, responded very quickly and was able to be discharged  home.     Kath   KR/MEDQ  D:  12/05/2004  T:  12/05/2004  Job:  [id number]

## 2011-04-21 NOTE — Op Note (Signed)
NAMEESSIE, LAGUNES              ACCOUNT NO.:  [id number]   MEDICAL RECORD NO.:  [id number]          PATIENT TYPE:  INP   LOCATION:  9146                          [facility]ACILITY:  [facility]   PHYSICIAN:  Huel Cote, M.D. DATE OF BIRTH:  Jun 20, 1966   DATE OF PROCEDURE:  11/09/2004  DATE OF DISCHARGE:                                 OPERATIVE REPORT   PREOPERATIVE DIAGNOSIS:  Retained placenta fragments.   POSTOPERATIVE DIAGNOSES:  1.  Retained placental fragments with possible partial accreta.  2.  Uterine atony.   PROCEDURE:  Manual inspection of uterus and removal of several placental  fragments.   SURGEON:  Huel Cote, M.D.   ANESTHESIA:  General which was converted to MAC.   ESTIMATED BLOOD LOSS:  1000 mL total from delivery room and the operating  room.   URINE OUTPUT:  200 mL straight catheter.   INTRAVENOUS FLUIDS:  1000 mL.   FINDINGS:  The uterus was boggy with retained fragments of placenta and  membranes noted.  There was also a large amount of clot retained in the  placenta which was manually removed.  After removal of the fragments and  clot, the uterus remained somewhat atonic but responded with Methergine and  Pitocin.   DESCRIPTION OF PROCEDURE:  The patient was taken to the operating room where  her MAC anesthesia was obtained without difficulty.  She was then prepped in  the normal sterile fashion in the dorsal lithotomy position.  A vaginal exam  was then attempted, and the patient continued to remain uncomfortable and  was moving substantially on the operating room table.  Therefore, the  decision was made to convert to general anesthesia.  After general  anesthesia was obtained, the patient was carefully examined and several  fragments of placenta and membrane were removed from the uterine fundus as  well as a significant amount of clot.  This was sent to pathology.  There  were some shredded decidua palpable in the uterine fundus after completion  of the manual exam.  However, no significant placental fragments remained.  Given this shredded feel of the lining, it was suspected that the patient  may have had a small partial accreta, however, no significant tissue was  felt to be left in the uterine fundus.  The uterus itself then remained  slightly atonic.  However, it responded well to Methergine and IV Pitocin as  well as  reduction in her _________.  The patient's bleeding was observed carefully  for approximately 10-15 minutes with no heavy bleeding noted, and the  uterine fundus remained well contracted.  Therefore, the patient was  awakened and taken to the recovery room in stable condition.      KR/MEDQ  D:  11/09/2004  T:  11/10/2004  Job:  [id number]

## 2014-07-08 ENCOUNTER — Encounter: Payer: Self-pay | Admitting: Certified Nurse Midwife

## 2014-07-10 ENCOUNTER — Encounter: Payer: Self-pay | Admitting: Certified Nurse Midwife

## 2014-07-10 ENCOUNTER — Ambulatory Visit (INDEPENDENT_AMBULATORY_CARE_PROVIDER_SITE_OTHER): Payer: 59 | Admitting: Certified Nurse Midwife

## 2014-07-10 VITALS — BP 110/72 | HR 74 | Resp 16 | Ht 69.75 in | Wt 204.0 lb

## 2014-07-10 DIAGNOSIS — Z01419 Encounter for gynecological examination (general) (routine) without abnormal findings: Secondary | ICD-10-CM

## 2014-07-10 DIAGNOSIS — Z124 Encounter for screening for malignant neoplasm of cervix: Secondary | ICD-10-CM

## 2014-07-10 DIAGNOSIS — Z0000 Encounter for general adult medical examination without abnormal findings: Secondary | ICD-10-CM

## 2014-07-10 LAB — POCT URINALYSIS DIPSTICK
Bilirubin, UA: NEGATIVE
Glucose, UA: NEGATIVE
KETONES UA: NEGATIVE
Leukocytes, UA: NEGATIVE
Nitrite, UA: NEGATIVE
PH UA: 5
PROTEIN UA: NEGATIVE
RBC UA: NEGATIVE
Urobilinogen, UA: NEGATIVE
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
## 2014-07-13 NOTE — Progress Notes (Signed)
Reviewed personally.  M. Suzanne Jazzalyn Loewenstein, MD.  

## 2014-07-13 NOTE — Addendum Note (Signed)
Addended by: Verner CholLEONARD, Britian Jentz S on: 07/13/2014 12:57 PM   Modules accepted: Orders

## 2014-07-15 LAB — IPS PAP TEST WITH REFLEX TO HPV

## 2014-09-03 DIAGNOSIS — J3089 Other allergic rhinitis: Secondary | ICD-10-CM | POA: Insufficient documentation

## 2014-10-05 ENCOUNTER — Encounter: Payer: Self-pay | Admitting: Certified Nurse Midwife

## 2014-10-05 ENCOUNTER — Other Ambulatory Visit: Payer: Self-pay

## 2014-10-05 DIAGNOSIS — Z1231 Encounter for screening mammogram for malignant neoplasm of breast: Secondary | ICD-10-CM

## 2014-10-06 ENCOUNTER — Other Ambulatory Visit: Payer: Self-pay

## 2014-10-06 DIAGNOSIS — Z1231 Encounter for screening mammogram for malignant neoplasm of breast: Secondary | ICD-10-CM

## 2014-10-21 ENCOUNTER — Ambulatory Visit
Admission: RE | Admit: 2014-10-21 | Discharge: 2014-10-21 | Disposition: A | Discharge status: Home / Self Care | Payer: 59 | Source: Ambulatory Visit

## 2014-10-21 ENCOUNTER — Encounter (INDEPENDENT_AMBULATORY_CARE_PROVIDER_SITE_OTHER): Payer: Self-pay

## 2014-10-21 DIAGNOSIS — Z1231 Encounter for screening mammogram for malignant neoplasm of breast: Secondary | ICD-10-CM

## 2015-07-20 ENCOUNTER — Ambulatory Visit (INDEPENDENT_AMBULATORY_CARE_PROVIDER_SITE_OTHER): Payer: 59 | Admitting: Certified Nurse Midwife

## 2015-07-20 ENCOUNTER — Encounter: Payer: Self-pay | Admitting: Certified Nurse Midwife

## 2015-07-20 VITALS — BP 120/60 | HR 88 | Resp 16 | Ht 70.0 in | Wt 211.0 lb

## 2015-07-20 DIAGNOSIS — Z01419 Encounter for gynecological examination (general) (routine) without abnormal findings: Secondary | ICD-10-CM

## 2015-07-20 DIAGNOSIS — Z0000 Encounter for general adult medical examination without abnormal findings: Secondary | ICD-10-CM

## 2015-07-20 DIAGNOSIS — E049 Nontoxic goiter, unspecified: Secondary | ICD-10-CM

## 2015-07-20 LAB — CBC
HCT: 39.6 % (ref 36.0–46.0)
HEMOGLOBIN: 12.9 g/dL (ref 12.0–15.0)
MCH: 27.4 pg (ref 26.0–34.0)
MCHC: 32.6 g/dL (ref 30.0–36.0)
MCV: 84.1 fL (ref 78.0–100.0)
MPV: 12.5 fL — ABNORMAL HIGH (ref 8.6–12.4)
PLATELETS: 282 10*3/uL (ref 150–400)
RBC: 4.71 MIL/uL (ref 3.87–5.11)
RDW: 13.8 % (ref 11.5–15.5)
WBC: 4.8 10*3/uL (ref 4.0–10.5)

## 2015-07-20 LAB — HEMOGLOBIN A1C
Hgb A1c MFr Bld: 6.1 % — ABNORMAL HIGH (ref <5.7)
Mean Plasma Glucose: 128 mg/dL — ABNORMAL HIGH (ref <117)

## 2015-07-20 LAB — COMPREHENSIVE METABOLIC PANEL
ALT: 21 U/L (ref 6–29)
AST: 20 U/L (ref 10–35)
Albumin: 3.9 g/dL (ref 3.6–5.1)
Alkaline Phosphatase: 65 U/L (ref 33–115)
BUN: 11 mg/dL (ref 7–25)
CALCIUM: 9.1 mg/dL (ref 8.6–10.2)
CHLORIDE: 103 mmol/L (ref 98–110)
CO2: 27 mmol/L (ref 20–31)
Creat: 0.69 mg/dL (ref 0.50–1.10)
GLUCOSE: 96 mg/dL (ref 65–99)
POTASSIUM: 3.3 mmol/L — AB (ref 3.5–5.3)
Sodium: 139 mmol/L (ref 135–146)
Total Bilirubin: 0.2 mg/dL (ref 0.2–1.2)
Total Protein: 7.5 g/dL (ref 6.1–8.1)

## 2015-07-20 LAB — LIPID PANEL
CHOLESTEROL: 160 mg/dL (ref 125–200)
HDL: 40 mg/dL — ABNORMAL LOW (ref >=46)
LDL Cholesterol: 96 mg/dL (ref <130)
Total CHOL/HDL Ratio: 4 Ratio (ref <=5.0)
Triglycerides: 121 mg/dL (ref <150)
VLDL: 24 mg/dL (ref <30)

## 2015-07-20 LAB — POCT URINALYSIS DIPSTICK
Bilirubin, UA: NEGATIVE
Blood, UA: NEGATIVE
Glucose, UA: NEGATIVE
KETONES UA: NEGATIVE
Leukocytes, UA: NEGATIVE
Nitrite, UA: NEGATIVE
PH UA: 5
PROTEIN UA: NEGATIVE
UROBILINOGEN UA: NEGATIVE

## 2015-07-20 LAB — VITAMIN D 25 HYDROXY (VIT D DEFICIENCY, FRACTURES): VIT D 25 HYDROXY: 12 ng/mL — AB (ref 30–100)

## 2015-07-20 LAB — THYROID PANEL WITH TSH
FREE THYROXINE INDEX: 1.9 (ref 1.4–3.8)
T3 UPTAKE: 29 % (ref 22–35)
T4 TOTAL: 6.4 ug/dL (ref 4.5–12.0)
TSH: 1.883 u[IU]/mL (ref 0.350–4.500)

## 2015-07-20 NOTE — Patient Instructions (Signed)
EXERCISE AND DIET:  We recommended that you start or continue a regular exercise program for good health. Regular exercise means any activity that makes your heart beat faster and makes you sweat.  We recommend exercising at least 30 minutes per day at least 3 days a week, preferably 4 or 5.  We also recommend a diet low in fat and sugar.  Inactivity, poor dietary choices and obesity can cause diabetes, heart attack, stroke, and kidney damage, among others.    ALCOHOL AND SMOKING:  Women should limit their alcohol intake to no more than 7 drinks/beers/glasses of wine (combined, not each!) per week. Moderation of alcohol intake to this level decreases your risk of breast cancer and liver damage. And of course, no recreational drugs are part of a healthy lifestyle.  And absolutely no smoking or even second hand smoke. Most people know smoking can cause heart and lung diseases, but did you know it also contributes to weakening of your bones? Aging of your skin?  Yellowing of your teeth and nails?  CALCIUM AND VITAMIN D:  Adequate intake of calcium and Vitamin D are recommended.  The recommendations for exact amounts of these supplements seem to change often, but generally speaking 600 mg of calcium (either carbonate or citrate) and 800 units of Vitamin D per day seems prudent. Certain women may benefit from higher intake of Vitamin D.  If you are among these women, your doctor will have told you during your visit.    PAP SMEARS:  Pap smears, to check for cervical cancer or precancers,  have traditionally been done yearly, although recent scientific advances have shown that most women can have pap smears less often.  However, every woman still should have a physical exam from her gynecologist every year. It will include a breast check, inspection of the vulva and vagina to check for abnormal growths or skin changes, a visual exam of the cervix, and then an exam to evaluate the size and shape of the uterus and  ovaries.  And after 48 years of age, a rectal exam is indicated to check for rectal cancers. We will also provide age appropriate advice regarding health maintenance, like when you should have certain vaccines, screening for sexually transmitted diseases, bone density testing, colonoscopy, mammograms, etc.   MAMMOGRAMS:  All women over 40 years old should have a yearly mammogram. Many facilities now offer a "3D" mammogram, which may cost around $50 extra out of pocket. If possible,  we recommend you accept the option to have the 3D mammogram performed.  It both reduces the number of women who will be called back for extra views which then turn out to be normal, and it is better than the routine mammogram at detecting truly abnormal areas.    COLONOSCOPY:  Colonoscopy to screen for colon cancer is recommended for all women at age 50.  We know, you hate the idea of the prep.  We agree, BUT, having colon cancer and not knowing it is worse!!  Colon cancer so often starts as a polyp that can be seen and removed at colonscopy, which can quite literally save your life!  And if your first colonoscopy is normal and you have no family history of colon cancer, most women don't have to have it again for 10 years.  Once every ten years, you can do something that may end up saving your life, right?  We will be happy to help you get it scheduled when you are ready.    Be sure to check your insurance coverage so you understand how much it will cost.  It may be covered as a preventative service at no cost, but you should check your particular policy.     Food Choices for Gastroesophageal Reflux Disease When you have gastroesophageal reflux disease (GERD), the foods you eat and your eating habits are very important. Choosing the right foods can help ease the discomfort of GERD. WHAT GENERAL GUIDELINES DO I NEED TO FOLLOW?  Choose fruits, vegetables, whole grains, low-fat dairy products, and low-fat meat, fish, and  poultry.  Limit fats such as oils, salad dressings, butter, nuts, and avocado.  Keep a food diary to identify foods that cause symptoms.  Avoid foods that cause reflux. These may be different for different people.  Eat frequent small meals instead of three large meals each day.  Eat your meals slowly, in a relaxed setting.  Limit fried foods.  Cook foods using methods other than frying.  Avoid drinking alcohol.  Avoid drinking large amounts of liquids with your meals.  Avoid bending over or lying down until 2-3 hours after eating. WHAT FOODS ARE NOT RECOMMENDED? The following are some foods and drinks that may worsen your symptoms: Vegetables Tomatoes. Tomato juice. Tomato and spaghetti sauce. Chili peppers. Onion and garlic. Horseradish. Fruits Oranges, grapefruit, and lemon (fruit and juice). Meats High-fat meats, fish, and poultry. This includes hot dogs, ribs, ham, sausage, salami, and bacon. Dairy Whole milk and chocolate milk. Sour cream. Cream. Butter. Ice cream. Cream cheese.  Beverages Coffee and tea, with or without caffeine. Carbonated beverages or energy drinks. Condiments Hot sauce. Barbecue sauce.  Sweets/Desserts Chocolate and cocoa. Donuts. Peppermint and spearmint. Fats and Oils High-fat foods, including French fries and potato chips. Other Vinegar. Strong spicesJamaicauch as black pepper, white pepper, red pepper, cayenne, curry powder, cloves, ginger, and chili powder. The items listed above may not be a complete list of foods and beverages to avoid. Contact your dietitian for more information. Document Released: 11/20/2005 Document Revised: 11/25/2013 Document Reviewed: 09/24/2013 [facility] Patient Information 2015 [place], [place]. This information is not intended to replace advice given to you by your health care provider. Make sure you discuss any questions you have with your health care provider.
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
## 2015-07-21 LAB — HEMOGLOBIN, FINGERSTICK: Hemoglobin, fingerstick: 13 g/dL (ref 12.0–16.0)

## 2015-07-21 NOTE — Progress Notes (Signed)
Reviewed personally.  M. Suzanne Keaira Whitehurst, MD.  

## 2015-07-27 ENCOUNTER — Telehealth: Payer: Self-pay | Admitting: Emergency Medicine

## 2015-07-27 DIAGNOSIS — E559 Vitamin D deficiency, unspecified: Secondary | ICD-10-CM

## 2015-07-27 MED ORDER — VITAMIN D (ERGOCALCIFEROL) 1.25 MG (50000 UNIT) PO CAPS: 50000.0000 [IU] | ORAL_CAPSULE | ORAL | Status: DC

## 2015-07-27 NOTE — Telephone Encounter (Signed)
-----   Message from Verner Chol, CNM sent at 07/23/2015  8:20 AM EDT ----- Notify Hgb A1-C is elevated which can increase risk of diabetes development, needs to work on balanced diet of protein and complex carbohydrates and limited sweets Vitamin D very low needs protocol TSH and panel normal CBC all normal except potassium low, absorption could be the problem with longterm Prilosec use for GERD, has she decided if she wants  GI referral for evaluation Lipid panel normal except for HDL which is low which is the protective cholesterol, needs to work on daily exercise to help bring this up CBC is normal Patient needs PCP management for elevated Hgb A1-c, and low potassium if no PCP can refer will need copies of labs to go with her

## 2015-07-27 NOTE — Telephone Encounter (Signed)
Spoke with patient and lab results message given.  Patient scheduled for thyroid ultrasound at [facility] 07/29/15 at 0745 arrival.  Verbalized understanding of lab results.  Order sent for rx Vitamin D. Patient will start with one tablet weekly for 8 weeks, then, over the counter Vitamin D3 800 international units daily for 8 weeks and return call for vitamin D check in four months. Future lab order made.  Referrals placed for both GI (patient agreeable) and referral to Primary care (patient has no preference for provider) Prefers either very early morning appointment (works night shifts) for mid afternoon appointments and not available most Wednesdays. Patient verbalized understanding of plan of care as instructed and will follow up as needed.   cc Lilyan Gilford for referrals processing.   Routing to provider for final review. Patient agreeable to disposition. Will close encounter.

## 2015-07-29 ENCOUNTER — Ambulatory Visit (HOSPITAL_COMMUNITY): Payer: 59

## 2015-07-30 ENCOUNTER — Other Ambulatory Visit: Payer: 59

## 2015-08-10 ENCOUNTER — Telehealth: Payer: Self-pay | Admitting: Certified Nurse Midwife

## 2015-08-10 NOTE — Telephone Encounter (Signed)
Left voicemail regarding referral appointment. The information is listed below. Should the patient need to cancel or reschedule this appointment, please advise them to call the office they've been referred to in order to reschedule.  Dr Dorise Hiss @ [facility] [place] [address] [place] 1st floor [place], [place] [address] Phone: [phone]   09/06/15 arrive at 745am for 8am appointment

## 2015-08-20 ENCOUNTER — Telehealth: Payer: Self-pay | Admitting: Emergency Medicine

## 2015-08-20 NOTE — Telephone Encounter (Signed)
Calling patient. She did not keep appointment for thyroid ultrasound that was scheduled for her at [facility] for 07/30/15 at 0845.  Call to patient. She states she has a lot going on and unsure if she wants to keep appointment. Also, $70.00 copay for ultrasound.  Advised patient ultrasound recommended due to enlarged thyroid on exam. Need to rule out any reason for enlargement such as cysts, nodules and tumors. Patient verbalized understanding.  Advised patient to call if she would like assistance to schedule. Patient agreeable.   Dr. Hyacinth Meeker.  Patient in imaging hold. Okay to send letter? Or follow up with patient?

## 2015-08-23 ENCOUNTER — Encounter: Payer: Self-pay | Admitting: Obstetrics & Gynecology

## 2015-08-23 NOTE — Telephone Encounter (Signed)
In addition to referral information/cancellation listed below, [facility] has attempted to call patient twice to schedule appointment. Patient has not returned calls and is not scheduled.  Please advise next step with her GI referral.

## 2015-08-23 NOTE — Telephone Encounter (Signed)
Forwarding to Debbi for additional recommendations.

## 2015-08-25 NOTE — Telephone Encounter (Signed)
Left message to call Kaitlyn at [phone]. 

## 2015-08-25 NOTE — Telephone Encounter (Signed)
If patient does want to follow up with GI, as she was complaining she had been on Prilosec and was concerned this was not helping her, that is her choice. She does need to follow up with enlarged thyroid due no evaluation history. I feel this is important can she schedule in 1- 2 months?

## 2015-09-06 ENCOUNTER — Ambulatory Visit: Payer: 59 | Admitting: Internal Medicine

## 2015-09-06 DIAGNOSIS — Z0289 Encounter for other administrative examinations: Secondary | ICD-10-CM

## 2015-09-08 NOTE — Telephone Encounter (Signed)
Call to patient, left message to call back. Can speak with any triage nurse. 

## 2015-09-28 NOTE — Telephone Encounter (Signed)
Call to patient. She declines referral to GI at this time. Will follow up as needed.   Patient states she simply cannot have ultrasound until beginning of the new year when she has a new flexible spending account card. She plans to have ultrasound in January. We again discussed reason for ultrasound and enlarged thyroid on exam, need to rule out reasons for enlarged thyroid such as cysts, nodules and tumors and may need potential biopsy of the thyroid. Patient states she understands the need for ultrasound but cannot make appointment at this time.   Patient also states that she has completed prescription vitamin D. She is now taking a daily multivitamin with Vitamin D. Offered to schedule lab follow up and patient declines at this time. She states she will call back to reschedule.  Patient remains in imaging hold.   Okay to close or needs letter for ultrasound?

## 2015-09-29 NOTE — Telephone Encounter (Signed)
Her lab levels were normal, and not symptomatic, If we can keep in hold and she does first of January and if she will advise of any change in her status, no letter at this time.

## 2015-09-30 NOTE — Telephone Encounter (Signed)
Continued imaging hold.  Will close encounter.  Routing to provider for final review. Patient agreeable to disposition. Will close encounter.

## 2015-12-22 ENCOUNTER — Other Ambulatory Visit: Payer: Self-pay

## 2015-12-22 DIAGNOSIS — Z1231 Encounter for screening mammogram for malignant neoplasm of breast: Secondary | ICD-10-CM

## 2016-01-12 ENCOUNTER — Ambulatory Visit
Admission: RE | Admit: 2016-01-12 | Discharge: 2016-01-12 | Disposition: A | Discharge status: Home / Self Care | Payer: 59 | Source: Ambulatory Visit

## 2016-01-12 DIAGNOSIS — Z1231 Encounter for screening mammogram for malignant neoplasm of breast: Secondary | ICD-10-CM | POA: Diagnosis not present

## 2016-02-16 ENCOUNTER — Encounter: Payer: Self-pay | Admitting: Emergency Medicine

## 2016-02-16 ENCOUNTER — Telehealth: Payer: Self-pay | Admitting: Emergency Medicine

## 2016-02-16 NOTE — Telephone Encounter (Signed)
Patient in imaging hold for Thyroid ultrasound. Patient stated at last phone call that she was going to schedule ultrasound with new benefit year of 2017.   Letter routed to Dr. Hyacinth MeekerMiller.

## 2016-03-07 NOTE — Telephone Encounter (Signed)
Letter to patient via certified mail on 02/21/16.  Patient called and she is given phone number to central scheduling: [phone].  Patient did not keep appointment that was scheduled for her to establish primary care and did not schedule follow up with GI.  Routing to PepsiCoDeborah Leonard CNM and routing to Dr. Hyacinth MeekerMiller.  Out of imaging hold per Dr. Hyacinth MeekerMiller.

## 2016-03-21 DIAGNOSIS — H524 Presbyopia: Secondary | ICD-10-CM | POA: Diagnosis not present

## 2016-07-20 ENCOUNTER — Ambulatory Visit: Payer: 59 | Admitting: Certified Nurse Midwife

## 2016-07-20 ENCOUNTER — Encounter: Payer: Self-pay | Admitting: Certified Nurse Midwife
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
## 2016-11-03 ENCOUNTER — Ambulatory Visit: Payer: 59 | Admitting: Certified Nurse Midwife

## 2016-11-03 ENCOUNTER — Encounter: Payer: Self-pay | Admitting: Certified Nurse Midwife
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
## 2016-12-12 ENCOUNTER — Encounter: Payer: Self-pay | Admitting: Certified Nurse Midwife

## 2017-09-14 ENCOUNTER — Encounter (HOSPITAL_COMMUNITY): Payer: Self-pay | Admitting: *Deleted

## 2017-09-14 ENCOUNTER — Emergency Department (HOSPITAL_COMMUNITY): Payer: 59

## 2017-09-14 ENCOUNTER — Emergency Department (HOSPITAL_COMMUNITY)
Admission: EM | Admit: 2017-09-14 | Discharge: 2017-09-14 | Disposition: A | Discharge status: Home / Self Care | Payer: 59 | Attending: Emergency Medicine | Admitting: Emergency Medicine

## 2017-09-14 DIAGNOSIS — R079 Chest pain, unspecified: Secondary | ICD-10-CM | POA: Diagnosis not present

## 2017-09-14 DIAGNOSIS — M7918 Myalgia, other site: Secondary | ICD-10-CM | POA: Insufficient documentation

## 2017-09-14 DIAGNOSIS — M791 Myalgia, unspecified site: Secondary | ICD-10-CM | POA: Diagnosis not present

## 2017-09-14 DIAGNOSIS — R9431 Abnormal electrocardiogram [ECG] [EKG]: Secondary | ICD-10-CM | POA: Diagnosis not present

## 2017-09-14 DIAGNOSIS — Z79899 Other long term (current) drug therapy: Secondary | ICD-10-CM | POA: Insufficient documentation

## 2017-09-14 LAB — I-STAT TROPONIN, ED: Troponin i, poc: 0 ng/mL (ref 0.00–0.08)

## 2017-09-14 LAB — BASIC METABOLIC PANEL
Anion gap: 8 (ref 5–15)
BUN: 10 mg/dL (ref 6–20)
CHLORIDE: 102 mmol/L (ref 101–111)
CO2: 27 mmol/L (ref 22–32)
Calcium: 9.2 mg/dL (ref 8.9–10.3)
Creatinine, Ser: 0.59 mg/dL (ref 0.44–1.00)
GFR calc Af Amer: >60 mL/min (ref >60)
GFR calc non Af Amer: >60 mL/min (ref >60)
Glucose, Bld: 152 mg/dL — ABNORMAL HIGH (ref 65–99)
POTASSIUM: 3.5 mmol/L (ref 3.5–5.1)
SODIUM: 137 mmol/L (ref 135–145)

## 2017-09-14 LAB — CBC
HEMATOCRIT: 42.3 % (ref 36.0–46.0)
Hemoglobin: 13.6 g/dL (ref 12.0–15.0)
MCH: 27.9 pg (ref 26.0–34.0)
MCHC: 32.2 g/dL (ref 30.0–36.0)
MCV: 86.9 fL (ref 78.0–100.0)
Platelets: 263 10*3/uL (ref 150–400)
RBC: 4.87 MIL/uL (ref 3.87–5.11)
RDW: 13.3 % (ref 11.5–15.5)
WBC: 7.8 10*3/uL (ref 4.0–10.5)

## 2017-09-14 MED ORDER — METHOCARBAMOL 500 MG PO TABS: 500.0000 mg | ORAL_TABLET | Freq: Two times a day (BID) | ORAL | 0 refills | Status: DC

## 2017-09-14 MED FILL — METHOCARBAMOL 500 MG TABLET: 500 | 10 days supply | Qty: 20 | Fill #0
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
## 2017-09-14 NOTE — ED Notes (Signed)
Patient ambulatory to the room.  No signs of distress noted.  Patient changing into gown and will be hooked up the monitor.

## 2017-09-14 NOTE — Discharge Instructions (Signed)
You may use 3 tablets ( ) of ibuprofen every 6 hours as needed for pain.

## 2017-09-14 NOTE — ED Triage Notes (Signed)
C/o chest pain with radaition into her back onset 2 days ago. States pain is worse when she moves or tries to bend over to pull trash.

## 2017-09-28 ENCOUNTER — Encounter (HOSPITAL_COMMUNITY): Payer: Self-pay | Admitting: Emergency Medicine

## 2017-09-28 ENCOUNTER — Ambulatory Visit (HOSPITAL_COMMUNITY)
Admission: EM | Admit: 2017-09-28 | Discharge: 2017-09-28 | Disposition: A | Discharge status: Home / Self Care | Payer: 59 | Attending: Emergency Medicine | Admitting: Emergency Medicine

## 2017-09-28 DIAGNOSIS — R0781 Pleurodynia: Secondary | ICD-10-CM

## 2017-09-28 MED ORDER — PREDNISONE 10 MG (21) PO TBPK: ORAL_TABLET | Freq: Every day | ORAL | 0 refills | Status: DC

## 2017-09-28 MED FILL — predniSONE 10 MG TABS: 10 | 12 days supply | Qty: 42 | Fill #0

## 2017-09-28 NOTE — Discharge Instructions (Signed)
Complete course of prednisone. Please drink plenty of fluids. Activity as tolerated. Heat or ice may be helpful. If develop shortness of breath, dizziness, worsening of pain or fevers please go to the ER. Please establish with a primary care provider and be reevaluated next week. Continue with daily reflux medications.
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
## 2017-09-28 NOTE — ED Triage Notes (Signed)
Pt c/o persistent right sided CP onset 16 days.... [facility] for similar sx  Sts pain will start on the back and radiate to the chest... Has noticed some swelling beneath right breast  Pain increases w/activity and breathing  Denies inj/trauma  Denies n/v, SOB, dyspnea, diaphoresis  Also c/o cold sx onset 1 month associated w/nasal congestion   A&O x4... NAD... Ambulatory

## 2018-01-04 DIAGNOSIS — H524 Presbyopia: Secondary | ICD-10-CM | POA: Diagnosis not present

## 2018-01-08 ENCOUNTER — Ambulatory Visit: Payer: Self-pay | Admitting: Nurse Practitioner

## 2018-03-05 ENCOUNTER — Ambulatory Visit: Payer: Self-pay | Admitting: Nurse Practitioner

## 2019-10-24 ENCOUNTER — Ambulatory Visit (HOSPITAL_COMMUNITY)
Admission: EM | Admit: 2019-10-24 | Discharge: 2019-10-24 | Disposition: A | Discharge status: Home / Self Care | Payer: 59 | Attending: Emergency Medicine | Admitting: Emergency Medicine

## 2019-10-24 ENCOUNTER — Encounter (HOSPITAL_COMMUNITY): Payer: Self-pay

## 2019-10-24 ENCOUNTER — Other Ambulatory Visit: Payer: Self-pay

## 2019-10-24 DIAGNOSIS — M546 Pain in thoracic spine: Secondary | ICD-10-CM | POA: Diagnosis not present

## 2019-10-24 LAB — BASIC METABOLIC PANEL
Anion gap: 13 (ref 5–15)
BUN: 16 mg/dL (ref 6–20)
CO2: 23 mmol/L (ref 22–32)
Calcium: 9.3 mg/dL (ref 8.9–10.3)
Chloride: 101 mmol/L (ref 98–111)
Creatinine, Ser: 0.42 mg/dL — ABNORMAL LOW (ref 0.44–1.00)
GFR calc Af Amer: >60 mL/min (ref >60)
GFR calc non Af Amer: >60 mL/min (ref >60)
Glucose, Bld: 206 mg/dL — ABNORMAL HIGH (ref 70–99)
Potassium: 3.9 mmol/L (ref 3.5–5.1)
Sodium: 137 mmol/L (ref 135–145)

## 2019-10-24 MED ORDER — CYCLOBENZAPRINE HCL 5 MG PO TABS: 5.0000 mg | ORAL_TABLET | Freq: Every day | ORAL | 0 refills | Status: DC

## 2019-10-24 MED ORDER — MELOXICAM 7.5 MG PO TABS: 7.5000 mg | ORAL_TABLET | Freq: Every day | ORAL | 0 refills | Status: DC

## 2019-10-24 MED FILL — MELOXICAM 7.5 MG TABLET: 7.5 | 20 days supply | Qty: 20 | Fill #0

## 2019-10-24 MED FILL — CYCLOBENZAPRINE 5 MG TABLET: 5 | 15 days supply | Qty: 15 | Fill #0
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
## 2019-10-24 NOTE — Discharge Instructions (Signed)
Light and regular activity and stretching as able.  Meloxicam daily, take with food. Continue to take daily omeprazole while taking this. Don't take any further Ibuprofen.  Flexeril at night as a muscle relaxer. This may be used up to every 8 hours as needed as well, may cause drowsiness, however.  Please continue to follow up with a primary care provider and/or sports medicine for persistent symptoms.  I will call you if I am concerned about your blood test.

## 2019-10-24 NOTE — ED Triage Notes (Signed)
Patient presents to Urgent Care with complaints of right sided back pain that gets worse throughout the day since a year ago. Patient reports it is sensitive to heat, when she was seen last year for same she was given prednisone that helped but it did not completely go away, pt also concerned w/ left ear itching.

## 2019-10-28 ENCOUNTER — Telehealth (HOSPITAL_COMMUNITY): Payer: Self-pay | Admitting: Emergency Medicine

## 2019-10-28 NOTE — Telephone Encounter (Signed)
Patient contacted and made aware of   lab results. Pt verbalized understanding and had all questions answered. She has follow up appt with PCP

## 2019-11-06 ENCOUNTER — Other Ambulatory Visit: Payer: Self-pay

## 2019-11-07 ENCOUNTER — Encounter: Payer: Self-pay | Admitting: Nurse Practitioner

## 2019-11-07 ENCOUNTER — Ambulatory Visit (INDEPENDENT_AMBULATORY_CARE_PROVIDER_SITE_OTHER): Payer: 59 | Admitting: Nurse Practitioner

## 2019-11-07 ENCOUNTER — Other Ambulatory Visit (HOSPITAL_COMMUNITY)
Admission: RE | Admit: 2019-11-07 | Discharge: 2019-11-07 | Disposition: A | Discharge status: Home / Self Care | Payer: 59 | Source: Ambulatory Visit | Attending: Nurse Practitioner | Admitting: Nurse Practitioner

## 2019-11-07 VITALS — BP 110/90 | HR 95 | Temp 97.5°F | Ht 70.0 in | Wt 229.0 lb

## 2019-11-07 DIAGNOSIS — Z113 Encounter for screening for infections with a predominantly sexual mode of transmission: Secondary | ICD-10-CM | POA: Diagnosis not present

## 2019-11-07 DIAGNOSIS — Z124 Encounter for screening for malignant neoplasm of cervix: Secondary | ICD-10-CM | POA: Diagnosis not present

## 2019-11-07 DIAGNOSIS — N760 Acute vaginitis: Secondary | ICD-10-CM

## 2019-11-07 DIAGNOSIS — E7800 Pure hypercholesterolemia, unspecified: Secondary | ICD-10-CM

## 2019-11-07 DIAGNOSIS — Z1231 Encounter for screening mammogram for malignant neoplasm of breast: Secondary | ICD-10-CM

## 2019-11-07 DIAGNOSIS — Z114 Encounter for screening for human immunodeficiency virus [HIV]: Secondary | ICD-10-CM | POA: Diagnosis not present

## 2019-11-07 DIAGNOSIS — M545 Low back pain: Secondary | ICD-10-CM

## 2019-11-07 DIAGNOSIS — E1165 Type 2 diabetes mellitus with hyperglycemia: Secondary | ICD-10-CM

## 2019-11-07 DIAGNOSIS — Z1211 Encounter for screening for malignant neoplasm of colon: Secondary | ICD-10-CM

## 2019-11-07 DIAGNOSIS — E559 Vitamin D deficiency, unspecified: Secondary | ICD-10-CM | POA: Diagnosis not present

## 2019-11-07 DIAGNOSIS — Z0001 Encounter for general adult medical examination with abnormal findings: Secondary | ICD-10-CM

## 2019-11-07 DIAGNOSIS — G8929 Other chronic pain: Secondary | ICD-10-CM

## 2019-11-07 LAB — COMPREHENSIVE METABOLIC PANEL
ALT: 36 U/L — ABNORMAL HIGH (ref 0–35)
AST: 32 U/L (ref 0–37)
Albumin: 4.1 g/dL (ref 3.5–5.2)
Alkaline Phosphatase: 69 U/L (ref 39–117)
BUN: 11 mg/dL (ref 6–23)
CO2: 26 mEq/L (ref 19–32)
Calcium: 9.1 mg/dL (ref 8.4–10.5)
Chloride: 102 mEq/L (ref 96–112)
Creatinine, Ser: 0.56 mg/dL (ref 0.40–1.20)
GFR: 137.46 mL/min (ref >60.00)
Glucose, Bld: 165 mg/dL — ABNORMAL HIGH (ref 70–99)
Potassium: 3.6 mEq/L (ref 3.5–5.1)
Sodium: 137 mEq/L (ref 135–145)
Total Bilirubin: 0.5 mg/dL (ref 0.2–1.2)
Total Protein: 7.4 g/dL (ref 6.0–8.3)

## 2019-11-07 LAB — CBC WITH DIFFERENTIAL/PLATELET
Basophils Absolute: 0.1 10*3/uL (ref 0.0–0.1)
Basophils Relative: 1.3 % (ref 0.0–3.0)
Eosinophils Absolute: 0.1 10*3/uL (ref 0.0–0.7)
Eosinophils Relative: 2.2 % (ref 0.0–5.0)
HCT: 42.9 % (ref 36.0–46.0)
Hemoglobin: 13.7 g/dL (ref 12.0–15.0)
Lymphocytes Relative: 27.9 % (ref 12.0–46.0)
Lymphs Abs: 1.2 10*3/uL (ref 0.7–4.0)
MCHC: 32 g/dL (ref 30.0–36.0)
MCV: 87.8 fl (ref 78.0–100.0)
Monocytes Absolute: 0.5 10*3/uL (ref 0.1–1.0)
Monocytes Relative: 11.5 % (ref 3.0–12.0)
Neutro Abs: 2.4 10*3/uL (ref 1.4–7.7)
Neutrophils Relative %: 57.1 % (ref 43.0–77.0)
Platelets: 190 10*3/uL (ref 150.0–400.0)
RBC: 4.88 Mil/uL (ref 3.87–5.11)
RDW: 13.2 % (ref 11.5–15.5)
WBC: 4.2 10*3/uL (ref 4.0–10.5)

## 2019-11-07 LAB — LIPID PANEL
Cholesterol: 184 mg/dL (ref 0–200)
HDL: 43.6 mg/dL (ref >39.00)
LDL Cholesterol: 129 mg/dL — ABNORMAL HIGH (ref 0–99)
NonHDL: 140.72
Total CHOL/HDL Ratio: 4
Triglycerides: 57 mg/dL (ref 0.0–149.0)
VLDL: 11.4 mg/dL (ref 0.0–40.0)

## 2019-11-07 LAB — HEMOGLOBIN A1C: Hgb A1c MFr Bld: 9.3 % — ABNORMAL HIGH (ref 4.6–6.5)

## 2019-11-07 LAB — TSH: TSH: 0.82 u[IU]/mL (ref 0.35–4.50)

## 2019-11-07 MED ORDER — MELOXICAM 7.5 MG PO TABS: 7.5000 mg | ORAL_TABLET | Freq: Every day | ORAL | 0 refills | Status: DC

## 2019-11-07 NOTE — Patient Instructions (Addendum)
Thank you for choosing [facility] for your health care needs. Merry Christmas and Happy New year.  Negative HIV. Normal PAP with negative HPV Normal TSh, cbc, renal and liver function. Abnormal lipid panelw ith mild elevation in LDL. This can improve with DASH diet. Elevated glucose with hgb A1c of 9.3 which confirms DM-type 2. Start metformin and glipizide. New rx sent. Check glucose once in AM (fasting) F/up in 37month via video appt with glucose readings.  Do not take ibuprofen or naproxen or advil or aleve, while taking meloxicam.  You will be contacted to schedule appt with nutirtionist and PT.  Health Maintenance, Female Adopting a healthy lifestyle and getting preventive care are important in promoting health and wellness. Ask your health care provider about:  The right schedule for you to have regular tests and exams.  Things you can do on your own to prevent diseases and keep yourself healthy. What should I know about diet, weight, and exercise? Eat a healthy diet   Eat a diet that includes plenty of vegetables, fruits, low-fat dairy products, and lean protein.  Do not eat a lot of foods that are high in solid fats, added sugars, or sodium. Maintain a healthy weight Body mass index (BMI) is used to identify weight problems. It estimates body fat based on height and weight. Your health care provider can help determine your BMI and help you achieve or maintain a healthy weight. Get regular exercise Get regular exercise. This is one of the most important things you can do for your health. Most adults should:  Exercise for at least 150 minutes each week. The exercise should increase your heart rate and make you sweat (moderate-intensity exercise).  Do strengthening exercises at least twice a week. This is in addition to the moderate-intensity exercise.  Spend less time sitting. Even light physical activity can be beneficial. Watch cholesterol and blood  lipids Have your blood tested for lipids and cholesterol at 52 years of age, then have this test every 5 years. Have your cholesterol levels checked more often if:  Your lipid or cholesterol levels are high.  You are older than 52 years of age.  You are at high risk for heart disease. What should I know about cancer screening? Depending on your health history and family history, you may need to have cancer screening at various ages. This may include screening for:  Breast cancer.  Cervical cancer.  Colorectal cancer.  Skin cancer.  Lung cancer. What should I know about heart disease, diabetes, and high blood pressure? Blood pressure and heart disease  High blood pressure causes heart disease and increases the risk of stroke. This is more likely to develop in people who have high blood pressure readings, are of African descent, or are overweight.  Have your blood pressure checked: ? Every 3-5 years if you are 79-63 years of age. ? Every year if you are 81 years old or older. Diabetes Have regular diabetes screenings. This checks your fasting blood sugar level. Have the screening done:  Once every three years after age 64 if you are at a normal weight and have a low risk for diabetes.  More often and at a younger age if you are overweight or have a high risk for diabetes. What should I know about preventing infection? Hepatitis B If you have a higher risk for hepatitis B, you should be screened for this virus. Talk with your health care provider to find out if you are at  risk for hepatitis B infection. Hepatitis C Testing is recommended for:  Everyone born from [birth year] through 1964.  Anyone with known risk factors for hepatitis C. Sexually transmitted infections (STIs)  Get screened for STIs, including gonorrhea and chlamydia, if: ? You are sexually active and are younger than 52 years of age. ? You are older than 52 years of age and your health care provider tells you that  you are at risk for this type of infection. ? Your sexual activity has changed since you were last screened, and you are at increased risk for chlamydia or gonorrhea. Ask your health care provider if you are at risk.  Ask your health care provider about whether you are at high risk for HIV. Your health care provider may recommend a prescription medicine to help prevent HIV infection. If you choose to take medicine to prevent HIV, you should first get tested for HIV. You should then be tested every 3 months for as long as you are taking the medicine. Pregnancy  If you are about to stop having your period (premenopausal) and you may become pregnant, seek counseling before you get pregnant.  Take 400 to 800 micrograms (mcg) of folic acid every day if you become pregnant.  Ask for birth control (contraception) if you want to prevent pregnancy. Osteoporosis and menopause Osteoporosis is a disease in which the bones lose minerals and strength with aging. This can result in bone fractures. If you are 31 years old or older, or if you are at risk for osteoporosis and fractures, ask your health care provider if you should:  Be screened for bone loss.  Take a calcium or vitamin D supplement to lower your risk of fractures.  Be given hormone replacement therapy (HRT) to treat symptoms of menopause. Follow these instructions at home: Lifestyle  Do not use any products that contain nicotine or tobacco, such as cigarettes, e-cigarettes, and chewing tobacco. If you need help quitting, ask your health care provider.  Do not use street drugs.  Do not share needles.  Ask your health care provider for help if you need support or information about quitting drugs. Alcohol use  Do not drink alcohol if: ? Your health care provider tells you not to drink. ? You are pregnant, may be pregnant, or are planning to become pregnant.  If you drink alcohol: ? Limit how much you use to 0-1 drink a day. ? Limit  intake if you are breastfeeding.  Be aware of how much alcohol is in your drink. In the [place] one drink equals one 12 oz bottle of beer (355 mL), one 5 oz glass of wine (148 mL), or one 1 oz glass of hard liquor (44 mL). General instructions  Schedule regular health, dental, and eye exams.  Stay current with your vaccines.  Tell your health care provider if: ? You often feel depressed. ? You have ever been abused or do not feel safe at home. Summary  Adopting a healthy lifestyle and getting preventive care are important in promoting health and wellness.  Follow your health care provider's instructions about healthy diet, exercising, and getting tested or screened for diseases.  Follow your health care provider's instructions on monitoring your cholesterol and blood pressure. This information is not intended to replace advice given to you by your health care provider. Make sure you discuss any questions you have with your health care provider. Document Released: 06/05/2011 Document Revised: 11/13/2018 Document Reviewed: 11/13/2018 Elsevier Patient Education  2020 [employer]  
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
## 2019-11-10 ENCOUNTER — Encounter: Payer: Self-pay | Admitting: Nurse Practitioner

## 2019-11-10 DIAGNOSIS — E7800 Pure hypercholesterolemia, unspecified: Secondary | ICD-10-CM | POA: Insufficient documentation

## 2019-11-10 DIAGNOSIS — E119 Type 2 diabetes mellitus without complications: Secondary | ICD-10-CM | POA: Insufficient documentation

## 2019-11-10 DIAGNOSIS — M545 Low back pain, unspecified: Secondary | ICD-10-CM | POA: Insufficient documentation

## 2019-11-10 DIAGNOSIS — G8929 Other chronic pain: Secondary | ICD-10-CM | POA: Insufficient documentation

## 2019-11-10 DIAGNOSIS — E1169 Type 2 diabetes mellitus with other specified complication: Secondary | ICD-10-CM | POA: Insufficient documentation

## 2019-11-10 DIAGNOSIS — E559 Vitamin D deficiency, unspecified: Secondary | ICD-10-CM | POA: Insufficient documentation

## 2019-11-10 DIAGNOSIS — E1165 Type 2 diabetes mellitus with hyperglycemia: Secondary | ICD-10-CM | POA: Insufficient documentation

## 2019-11-10 LAB — CYTOLOGY - PAP
Comment: NEGATIVE
Diagnosis: NEGATIVE
High risk HPV: NEGATIVE

## 2019-11-10 LAB — CERVICOVAGINAL ANCILLARY ONLY
Bacterial Vaginitis (gardnerella): POSITIVE — AB
Candida Glabrata: NEGATIVE
Candida Vaginitis: POSITIVE — AB
Chlamydia: NEGATIVE
Comment: NEGATIVE
Comment: NEGATIVE
Comment: NEGATIVE
Comment: NEGATIVE
Comment: NEGATIVE
Comment: NORMAL
Neisseria Gonorrhea: NEGATIVE
Trichomonas: NEGATIVE

## 2019-11-10 MED ORDER — FLUCONAZOLE 150 MG PO TABS: 150.0000 mg | ORAL_TABLET | Freq: Every day | ORAL | 0 refills | Status: DC

## 2019-11-10 MED ORDER — METRONIDAZOLE 0.75 % VA GEL: 1.0000 | Freq: Every day | VAGINAL | 0 refills | Status: DC

## 2019-11-10 MED ORDER — BLOOD GLUCOSE MONITOR KIT: PACK | 0 refills | Status: DC

## 2019-11-10 MED ORDER — METFORMIN HCL 500 MG PO TABS: 500.0000 mg | ORAL_TABLET | Freq: Two times a day (BID) | ORAL | 5 refills | Status: DC

## 2019-11-10 MED ORDER — GLIPIZIDE ER 5 MG PO TB24: 5.0000 mg | ORAL_TABLET | Freq: Every day | ORAL | 5 refills | Status: DC

## 2019-11-10 MED FILL — glipiZIDE XL 5 MG TB24: 5 | 30 days supply | Qty: 30 | Fill #0

## 2019-11-10 MED FILL — FREESTYLE LITE METER: 30 days supply | Qty: 1 | Fill #0

## 2019-11-10 MED FILL — metroNIDAZOLE 0.75 % GEL: 0.75 | 5 days supply | Qty: 70 | Fill #0

## 2019-11-10 MED FILL — MELOXICAM 7.5 MG TABLET: 7.5 | 30 days supply | Qty: 30 | Fill #0

## 2019-11-10 MED FILL — FREESTYLE LITE TEST STRIP: 90 days supply | Qty: 100 | Fill #0

## 2019-11-10 MED FILL — metFORMIN HCL 500 MG TABS: 500 | 30 days supply | Qty: 60 | Fill #0

## 2019-11-10 MED FILL — FREESTYLE LANCETS: 90 days supply | Qty: 100 | Fill #0

## 2019-11-10 MED FILL — FLUCONAZOLE 150 MG TABLET: 150 | 3 days supply | Qty: 2 | Fill #0

## 2019-11-11 ENCOUNTER — Encounter: Payer: Self-pay | Admitting: Gastroenterology

## 2019-11-11 LAB — HIV ANTIBODY (ROUTINE TESTING W REFLEX): HIV 1&2 Ab, 4th Generation: NONREACTIVE

## 2019-11-11 LAB — VITAMIN D 1,25 DIHYDROXY
Vitamin D 1, 25 (OH)2 Total: 56 pg/mL (ref 18–72)
Vitamin D2 1, 25 (OH)2: <8 pg/mL
Vitamin D3 1, 25 (OH)2: 56 pg/mL

## 2019-11-17 ENCOUNTER — Telehealth: Payer: Self-pay | Admitting: Nurse Practitioner

## 2019-11-17 NOTE — Telephone Encounter (Signed)
Pt is aware, do not want paper work to fax, we can shred the form.

## 2019-11-17 NOTE — Telephone Encounter (Signed)
Patient is calling regarding paper work that she faxed on Friday regarding that she is high risk and should be excluded from [facility] rooms. Please advise CB- [phone]

## 2019-11-17 NOTE — Telephone Encounter (Signed)
Please advise, last ov was 121/03/2019

## 2019-11-17 NOTE — Telephone Encounter (Signed)
Advised the pt of response.   Pt request to check with Baldo Ash again, if Baldo Ash can add on a note in the paper work to be excluded not to clean Covid pt's room during work hours due to the pt health issue with DM.

## 2019-11-17 NOTE — Telephone Encounter (Signed)
She does not meet criteria to be excluded. She is not on an immunosuppressant.

## 2019-11-17 NOTE — Telephone Encounter (Signed)
Form completed.

## 2019-11-20 ENCOUNTER — Telehealth: Payer: Self-pay | Admitting: Nurse Practitioner

## 2019-11-20 ENCOUNTER — Other Ambulatory Visit: Payer: Self-pay | Admitting: Family Medicine

## 2019-11-20 ENCOUNTER — Encounter: Payer: Self-pay | Admitting: Nurse Practitioner

## 2019-11-20 DIAGNOSIS — E1165 Type 2 diabetes mellitus with hyperglycemia: Secondary | ICD-10-CM

## 2019-11-20 MED ORDER — GLIPIZIDE ER 2.5 MG PO TB24: 2.5000 mg | ORAL_TABLET | Freq: Every day | ORAL | 0 refills | Status: DC

## 2019-11-20 MED FILL — glipiZIDE ER 2.5 MG TB24: 2.5 | 90 days supply | Qty: 90 | Fill #0

## 2019-11-20 NOTE — Telephone Encounter (Signed)
I would recommend holding the current dose of glipizide.  I am going to send over the 2.5mg  dose.  She should continue metformin.

## 2019-11-20 NOTE — Telephone Encounter (Signed)
Pt called stating her blood sugar seem to be running low since she started glipizide and metformin. Pt stated she is taking glipizide 1 daily the AM and metformin BID (one the morning and on the PM with meal). Pt report blood sugar reading yesterday fasting 140 am, prior to lunch it was 49 and by evening it was 89 (she took rx as prescribed). Today she report blood sugar reading at 6:30 am for 130. Pt mention some days, her blood sugar might be as low as 60.   Today she took the glipizide only (skip metformin AM dose). I ask the pt to send the blood sugar reading once she gets home from work today. Pt knows what to do if her blood sugar is too low--   Dr. Zigmund Daniel please advise in absence of Baldo Ash today.     Copied from [place] [id number]. Topic: General - Inquiry >> Nov 19, 2019  5:42 PM Alease Frame wrote: Reason for CRM: Patient called in wanting to speak with Dr Lorayne Marek regarding her new medication. Please advise

## 2019-11-20 NOTE — Telephone Encounter (Signed)
Pt is aware of message below. Advise the pt to call if she has any question.

## 2019-12-09 ENCOUNTER — Other Ambulatory Visit: Payer: Self-pay

## 2019-12-09 ENCOUNTER — Ambulatory Visit (AMBULATORY_SURGERY_CENTER): Payer: 59

## 2019-12-09 VITALS — Temp 97.9°F | Ht 70.0 in | Wt 227.6 lb

## 2019-12-09 DIAGNOSIS — Z1159 Encounter for screening for other viral diseases: Secondary | ICD-10-CM

## 2019-12-09 DIAGNOSIS — Z1211 Encounter for screening for malignant neoplasm of colon: Secondary | ICD-10-CM

## 2019-12-09 MED ORDER — NA SULFATE-K SULFATE-MG SULF 17.5-3.13-1.6 GM/177ML PO SOLN: 1.0000 | Freq: Once | ORAL | 0 refills | Status: AC

## 2019-12-09 MED FILL — SUPREP BOWEL PREP KIT: 17.5-3.13-1 | 1 days supply | Qty: 354 | Fill #0

## 2019-12-09 NOTE — Progress Notes (Signed)
No egg or soy allergy known to patient  No issues with past sedation with any surgeries  or procedures, no intubation problems  No diet pills per patient No home 02 use per patient  No blood thinners per patient  Pt denies issues with constipation  No A fib or A flutter  EMMI video sent to pt's e mail  [employer] employee  Due to the COVID-19 pandemic we are asking patients to follow these guidelines. Please only bring one care partner. Please be aware that your care partner may wait in the car in the parking lot or if they feel like they will be too hot to wait in the car, they may wait in the lobby on the 4th floor. All care partners are required to wear a mask the entire time (we do not have any that we can provide them), they need to practice social distancing, and we will do a Covid check for all patient's and care partners when you arrive. Also we will check their temperature and your temperature. If the care partner waits in their car they need to stay in the parking lot the entire time and we will call them on their cell phone when the patient is ready for discharge so they can bring the car to the front of the building. Also all patient's will need to wear a mask into building.

## 2019-12-10 ENCOUNTER — Encounter: Payer: Self-pay | Admitting: Gastroenterology

## 2019-12-11 MED FILL — metFORMIN HCL 500 MG TABS: 500 | 30 days supply | Qty: 60 | Fill #1

## 2019-12-19 ENCOUNTER — Other Ambulatory Visit: Payer: Self-pay | Admitting: Gastroenterology

## 2019-12-19 ENCOUNTER — Ambulatory Visit (INDEPENDENT_AMBULATORY_CARE_PROVIDER_SITE_OTHER): Payer: 59

## 2019-12-19 DIAGNOSIS — Z1159 Encounter for screening for other viral diseases: Secondary | ICD-10-CM

## 2019-12-19 LAB — SARS CORONAVIRUS 2 (TAT 6-24 HRS): SARS Coronavirus 2: NEGATIVE

## 2019-12-23 ENCOUNTER — Encounter: Payer: Self-pay | Admitting: Gastroenterology

## 2019-12-23 ENCOUNTER — Ambulatory Visit (AMBULATORY_SURGERY_CENTER): Payer: 59 | Admitting: Gastroenterology

## 2019-12-23 ENCOUNTER — Other Ambulatory Visit: Payer: Self-pay

## 2019-12-23 VITALS — BP 102/68 | HR 67 | Temp 98.7°F | Resp 10 | Ht 70.0 in | Wt 227.6 lb

## 2019-12-23 DIAGNOSIS — E119 Type 2 diabetes mellitus without complications: Secondary | ICD-10-CM | POA: Diagnosis not present

## 2019-12-23 DIAGNOSIS — K219 Gastro-esophageal reflux disease without esophagitis: Secondary | ICD-10-CM | POA: Diagnosis not present

## 2019-12-23 DIAGNOSIS — Z1211 Encounter for screening for malignant neoplasm of colon: Secondary | ICD-10-CM

## 2019-12-23 MED ORDER — SODIUM CHLORIDE 0.9 % IV SOLN: 500.0000 mL | Freq: Once | INTRAVENOUS | Status: DC

## 2019-12-23 NOTE — Progress Notes (Signed)
Temp JB VS DT  Pt's states no medical or surgical changes since previsit or office visit. 

## 2019-12-23 NOTE — Progress Notes (Signed)
Report given to PACU, vss 

## 2019-12-23 NOTE — Op Note (Signed)
[facility] Patient Name: Amy [name] Procedure Date: 12/23/2019 11:37 AM MRN: [id number] Endoscopist: Napoleon Form , MD Age: 54 Referring MD:  Date of Birth: 08-Mar-1966 Gender: Female Account #: [id number] Procedure:                Colonoscopy Indications:              Screening for colorectal malignant neoplasm Medicines:                Monitored Anesthesia Care Procedure:                Pre-Anesthesia Assessment:                           - Prior to the procedure, a History and Physical                            was performed, and patient medications and                            allergies were reviewed. The patient's tolerance of                            previous anesthesia was also reviewed. The risks                            and benefits of the procedure and the sedation                            options and risks were discussed with the patient.                            All questions were answered, and informed consent                            was obtained. Prior Anticoagulants: The patient has                            taken no previous anticoagulant or antiplatelet                            agents. ASA Grade Assessment: II - A patient with                            mild systemic disease. After reviewing the risks                            and benefits, the patient was deemed in                            satisfactory condition to undergo the procedure.                           After obtaining informed consent, the colonoscope  was passed under direct vision. Throughout the                            procedure, the patient's blood pressure, pulse, and                            oxygen saturations were monitored continuously. The                            Colonoscope was introduced through the anus and                            advanced to the the cecum, identified by                            appendiceal orifice  and ileocecal valve. The                            colonoscopy was performed without difficulty. The                            patient tolerated the procedure well. The quality                            of the bowel preparation was good. The ileocecal                            valve, appendiceal orifice, and rectum were                            photographed. Scope In: 11:44:53 AM Scope Out: 11:59:13 AM Scope Withdrawal Time: 0 hours 9 minutes 31 seconds  Total Procedure Duration: 0 hours 14 minutes 20 seconds  Findings:                 The perianal and digital rectal examinations were                            normal.                           Scattered small and large-mouthed diverticula were                            found in the sigmoid colon, descending colon,                            transverse colon and ascending colon.                           Non-bleeding internal hemorrhoids were found during                            retroflexion. The hemorrhoids were small.  The exam was otherwise without abnormality. Complications:            No immediate complications. Estimated Blood Loss:     Estimated blood loss was minimal. Impression:               - Mild diverticulosis in the sigmoid colon, in the                            descending colon, in the transverse colon and in                            the ascending colon.                           - Non-bleeding internal hemorrhoids.                           - The examination was otherwise normal.                           - No specimens collected. Recommendation:           - Patient has a contact number available for                            emergencies. The signs and symptoms of potential                            delayed complications were discussed with the                            patient. Return to normal activities tomorrow.                            Written discharge instructions were  provided to the                            patient.                           - Resume previous diet.                           - Continue present medications.                           - Repeat colonoscopy in 10 years for screening                            purposes. Napoleon Form, MD 12/23/2019 12:05:31 PM This report has been signed electronically.

## 2019-12-23 NOTE — Progress Notes (Signed)
No problems noted in the recovery room. maw 

## 2019-12-23 NOTE — Patient Instructions (Signed)
YOU HAD AN ENDOSCOPIC PROCEDURE TODAY AT [facility]:   Refer to the procedure report that was given to you for any specific questions about what was found during the examination.  If the procedure report does not answer your questions, please call your gastroenterologist to clarify.  If you requested that your care partner not be given the details of your procedure findings, then the procedure report has been included in a sealed envelope for you to review at your convenience later.  YOU SHOULD EXPECT: Some feelings of bloating in the abdomen. Passage of more gas than usual.  Walking can help get rid of the air that was put into your GI tract during the procedure and reduce the bloating. If you had a lower endoscopy (such as a colonoscopy or flexible sigmoidoscopy) you may notice spotting of blood in your stool or on the toilet paper. If you underwent a bowel prep for your procedure, you may not have a normal bowel movement for a few days.  Please Note:  You might notice some irritation and congestion in your nose or some drainage.  This is from the oxygen used during your procedure.  There is no need for concern and it should clear up in a day or so.  SYMPTOMS TO REPORT IMMEDIATELY:   Following lower endoscopy (colonoscopy or flexible sigmoidoscopy):  Excessive amounts of blood in the stool  Significant tenderness or worsening of abdominal pains  Swelling of the abdomen that is new, acute  Fever of 100F or higher    For urgent or emergent issues, a gastroenterologist can be reached at any hour by calling [phone].   DIET:  We do recommend a small meal at first, but then you may proceed to your regular diet.  Drink plenty of fluids but you should avoid alcoholic beverages for 24 hours.  ACTIVITY:  You should plan to take it easy for the rest of today and you should NOT DRIVE or use heavy machinery until tomorrow (because of the sedation medicines used during the test).     FOLLOW UP: Our staff will call the number listed on your records 48-72 hours following your procedure to check on you and address any questions or concerns that you may have regarding the information given to you following your procedure. If we do not reach you, we will leave a message.  We will attempt to reach you two times.  During this call, we will ask if you have developed any symptoms of COVID 19. If you develop any symptoms (ie: fever, flu-like symptoms, shortness of breath, cough etc.) before then, please call [phone].  If you test positive for Covid 19 in the 2 weeks post procedure, please call and report this information to [place].    If any biopsies were taken you will be contacted by phone or by letter within the next 1-3 weeks.  Please call us at [phone] if you have not heard about the biopsies in 3 weeks.    SIGNATURES/CONFIDENTIALITY: You and/or your care partner have signed paperwork which will be entered into your electronic medical record.  These signatures attest to the fact that that the information above on your After Visit Summary has been reviewed and is understood.  Full responsibility of the confidentiality of this discharge information lies with you and/or your care-partner.    Handouts were given to you on diverticulosis and hemorrhoids. Your blood sugar was 113 in the recovery room. You may resume your current  medications today. Repeat colonoscopy in 10 years for screening purposes. Please call if any questions or concerns.

## 2019-12-25 ENCOUNTER — Telehealth: Payer: Self-pay

## 2019-12-25 NOTE — Telephone Encounter (Signed)
  Follow up Call-  Call back number 12/23/2019  Post procedure Call Back phone  # [phone]  Permission to leave phone message Yes  Some recent data might be hidden     Patient questions:  Do you have a fever, pain , or abdominal swelling? No. Pain Score  0 *  Have you tolerated food without any problems? Yes.    Have you been able to return to your normal activities? Yes.    Do you have any questions about your discharge instructions: Diet   No. Medications  No. Follow up visit  No.  Do you have questions or concerns about your Care? No.  Actions: * If pain score is 4 or above: No action needed, pain <4.  1. Have you developed a fever since your procedure? No  2.   Have you had an respiratory symptoms (SOB or cough) since your procedure? no  3.   Have you tested positive for COVID 19 since your procedure no  4.   Have you had any family members/close contacts diagnosed with the COVID 19 since your procedure?  no   If yes to any of these questions please route to Laverna Peace, RN and Jennye Boroughs, Charity fundraiser.

## 2020-01-02 ENCOUNTER — Ambulatory Visit: Payer: 59

## 2020-01-20 MED FILL — metFORMIN HCL 500 MG TABS: 500 | 90 days supply | Qty: 180 | Fill #2

## 2020-01-30 ENCOUNTER — Ambulatory Visit
Admission: RE | Admit: 2020-01-30 | Discharge: 2020-01-30 | Disposition: A | Discharge status: Home / Self Care | Payer: 59 | Source: Ambulatory Visit | Attending: Nurse Practitioner | Admitting: Nurse Practitioner

## 2020-01-30 ENCOUNTER — Other Ambulatory Visit: Payer: Self-pay

## 2020-01-30 DIAGNOSIS — Z0001 Encounter for general adult medical examination with abnormal findings: Secondary | ICD-10-CM

## 2020-01-30 DIAGNOSIS — Z1231 Encounter for screening mammogram for malignant neoplasm of breast: Secondary | ICD-10-CM | POA: Diagnosis not present

## 2020-05-11 ENCOUNTER — Other Ambulatory Visit: Payer: Self-pay | Admitting: Nurse Practitioner

## 2020-06-02 MED FILL — METFORMIN HCL 500 MG TABS: 500 | 30 days supply | Qty: 60 | Fill #3

## 2020-07-20 ENCOUNTER — Other Ambulatory Visit: Payer: Self-pay | Admitting: Nurse Practitioner

## 2020-07-20 DIAGNOSIS — E1165 Type 2 diabetes mellitus with hyperglycemia: Secondary | ICD-10-CM

## 2020-07-20 MED FILL — METFORMIN HCL 500 MG TABS: 500 | 30 days supply | Qty: 60 | Fill #0

## 2020-07-20 NOTE — Telephone Encounter (Signed)
11/07/19 - A1C 9.3. Refilled medication.

## 2020-09-01 MED FILL — METFORMIN HCL 500 MG TABS: 500 | 30 days supply | Qty: 60 | Fill #1

## 2020-10-20 MED FILL — METFORMIN HCL 500 MG TABS: 500 | 30 days supply | Qty: 60 | Fill #2

## 2020-11-29 MED FILL — METFORMIN HCL 500 MG TABS: 500 | 30 days supply | Qty: 60 | Fill #3

## 2020-12-24 ENCOUNTER — Ambulatory Visit: Payer: 59 | Admitting: Nurse Practitioner

## 2021-01-11 MED FILL — METFORMIN HCL 500 MG TABS: 500 | 30 days supply | Qty: 60 | Fill #4

## 2021-01-27 ENCOUNTER — Other Ambulatory Visit: Payer: Self-pay

## 2021-01-28 ENCOUNTER — Other Ambulatory Visit: Payer: Self-pay | Admitting: Nurse Practitioner

## 2021-01-28 ENCOUNTER — Encounter: Payer: Self-pay | Admitting: Nurse Practitioner

## 2021-01-28 ENCOUNTER — Ambulatory Visit (INDEPENDENT_AMBULATORY_CARE_PROVIDER_SITE_OTHER): Payer: 59 | Admitting: Nurse Practitioner

## 2021-01-28 ENCOUNTER — Telehealth: Payer: Self-pay | Admitting: Nurse Practitioner

## 2021-01-28 VITALS — BP 128/88 | HR 86 | Temp 97.0°F | Ht 70.0 in | Wt 225.4 lb

## 2021-01-28 DIAGNOSIS — K219 Gastro-esophageal reflux disease without esophagitis: Secondary | ICD-10-CM

## 2021-01-28 DIAGNOSIS — H524 Presbyopia: Secondary | ICD-10-CM | POA: Diagnosis not present

## 2021-01-28 DIAGNOSIS — H52223 Regular astigmatism, bilateral: Secondary | ICD-10-CM | POA: Diagnosis not present

## 2021-01-28 DIAGNOSIS — E7800 Pure hypercholesterolemia, unspecified: Secondary | ICD-10-CM | POA: Diagnosis not present

## 2021-01-28 DIAGNOSIS — Z23 Encounter for immunization: Secondary | ICD-10-CM

## 2021-01-28 DIAGNOSIS — L309 Dermatitis, unspecified: Secondary | ICD-10-CM | POA: Diagnosis not present

## 2021-01-28 DIAGNOSIS — E1165 Type 2 diabetes mellitus with hyperglycemia: Secondary | ICD-10-CM

## 2021-01-28 DIAGNOSIS — Z0001 Encounter for general adult medical examination with abnormal findings: Secondary | ICD-10-CM

## 2021-01-28 DIAGNOSIS — H5203 Hypermetropia, bilateral: Secondary | ICD-10-CM | POA: Diagnosis not present

## 2021-01-28 LAB — COMPREHENSIVE METABOLIC PANEL
ALT: 36 U/L — ABNORMAL HIGH (ref 0–35)
AST: 36 U/L (ref 0–37)
Albumin: 4 g/dL (ref 3.5–5.2)
Alkaline Phosphatase: 92 U/L (ref 39–117)
BUN: 16 mg/dL (ref 6–23)
CO2: 30 mEq/L (ref 19–32)
Calcium: 9.3 mg/dL (ref 8.4–10.5)
Chloride: 102 mEq/L (ref 96–112)
Creatinine, Ser: 0.56 mg/dL (ref 0.40–1.20)
GFR: 104.21 mL/min (ref >60.00)
Glucose, Bld: 161 mg/dL — ABNORMAL HIGH (ref 70–99)
Potassium: 3.7 mEq/L (ref 3.5–5.1)
Sodium: 137 mEq/L (ref 135–145)
Total Bilirubin: 0.5 mg/dL (ref 0.2–1.2)
Total Protein: 7.9 g/dL (ref 6.0–8.3)

## 2021-01-28 LAB — LIPID PANEL
Cholesterol: 187 mg/dL (ref 0–200)
HDL: 46.6 mg/dL (ref >39.00)
LDL Cholesterol: 123 mg/dL — ABNORMAL HIGH (ref 0–99)
NonHDL: 140.22
Total CHOL/HDL Ratio: 4
Triglycerides: 85 mg/dL (ref 0.0–149.0)
VLDL: 17 mg/dL (ref 0.0–40.0)

## 2021-01-28 LAB — CBC
HCT: 41.8 % (ref 36.0–46.0)
Hemoglobin: 13.6 g/dL (ref 12.0–15.0)
MCHC: 32.4 g/dL (ref 30.0–36.0)
MCV: 86.1 fl (ref 78.0–100.0)
Platelets: 189 10*3/uL (ref 150.0–400.0)
RBC: 4.85 Mil/uL (ref 3.87–5.11)
RDW: 13.5 % (ref 11.5–15.5)
WBC: 4.1 10*3/uL (ref 4.0–10.5)

## 2021-01-28 LAB — HEMOGLOBIN A1C: Hgb A1c MFr Bld: 8.2 % — ABNORMAL HIGH (ref 4.6–6.5)

## 2021-01-28 LAB — MICROALBUMIN / CREATININE URINE RATIO
Creatinine,U: 129.1 mg/dL
Microalb Creat Ratio: 2.4 mg/g (ref 0.0–30.0)
Microalb, Ur: 3 mg/dL — ABNORMAL HIGH (ref 0.0–1.9)

## 2021-01-28 MED ORDER — TRIAMCINOLONE ACETONIDE 0.1 % EX CREA: 1.0000 "application " | TOPICAL_CREAM | Freq: Two times a day (BID) | CUTANEOUS | 0 refills | Status: DC

## 2021-01-28 MED ORDER — LISINOPRIL 2.5 MG PO TABS: 2.5000 mg | ORAL_TABLET | Freq: Every day | ORAL | 3 refills | Status: DC

## 2021-01-28 MED ORDER — OMEPRAZOLE 20 MG PO CPDR: 20.0000 mg | DELAYED_RELEASE_CAPSULE | Freq: Every day | ORAL | 3 refills | Status: DC

## 2021-01-28 MED ORDER — METFORMIN HCL 1000 MG PO TABS: 1000.0000 mg | ORAL_TABLET | Freq: Two times a day (BID) | ORAL | 3 refills | Status: DC

## 2021-01-28 MED FILL — OMEPRAZOLE DR 20 MG CAPSULE: 20 | 90 days supply | Qty: 90 | Fill #0

## 2021-01-28 MED FILL — TRIAMCINOLONE 0.1% CREAM: 0.1 | 30 days supply | Qty: 80 | Fill #0

## 2021-01-28 NOTE — Patient Instructions (Signed)
Schedule appt for annual mammogram. Have eye exam report faxed to me. Needs to be a diabetic eye exam. Schedule appt for dental cleaning every 6-71month.   Go to lab for blood draw.   Preventive Care 89654Years Old, Female Preventive care refers to lifestyle choices and visits with your health care provider that can promote health and wellness. This includes:  A yearly physical exam. This is also called an annual wellness visit.  Regular dental and eye exams.  Immunizations.  Screening for certain conditions.  Healthy lifestyle choices, such as: ? Eating a healthy diet. ? Getting regular exercise. ? Not using drugs or products that contain nicotine and tobacco. ? Limiting alcohol use. What can I expect for my preventive care visit? Physical exam Your health care provider will check your:  Height and weight. These may be used to calculate your BMI (body mass index). BMI is a measurement that tells if you are at a healthy weight.  Heart rate and blood pressure.  Body temperature.  Skin for abnormal spots. Counseling Your health care provider may ask you questions about your:  Past medical problems.  Family's medical history.  Alcohol, tobacco, and drug use.  Emotional well-being.  Home life and relationship well-being.  Sexual activity.  Diet, exercise, and sleep habits.  Work and work eStatistician  Access to firearms.  Method of birth control.  Menstrual cycle.  Pregnancy history. What immunizations do I need? Vaccines are usually given at various ages, according to a schedule. Your health care provider will recommend vaccines for you based on your age, medical history, and lifestyle or other factors, such as travel or where you work.   What tests do I need? Blood tests  Lipid and cholesterol levels. These may be checked every 5 years, or more often if you are over 54years old.  Hepatitis C test.  Hepatitis B test. Screening  Lung cancer  screening. You may have this screening every year starting at age 5479if you have a 30-pack-year history of smoking and currently smoke or have quit within the past 15 years.  Colorectal cancer screening. ? All adults should have this screening starting at age 5464and continuing until age 55454 ? Your health care provider may recommend screening at age 777if you are at increased risk. ? You will have tests every 1-10 years, depending on your results and the type of screening test.  Diabetes screening. ? This is done by checking your blood sugar (glucose) after you have not eaten for a while (fasting). ? You may have this done every 1-3 years.  Mammogram. ? This may be done every 1-2 years. ? Talk with your health care provider about when you should start having regular mammograms. This may depend on whether you have a family history of breast cancer.  BRCA-related cancer screening. This may be done if you have a family history of breast, ovarian, tubal, or peritoneal cancers.  Pelvic exam and Pap test. ? This may be done every 3 years starting at age 55481 ? Starting at age 54 this may be done every 5 years if you have a Pap test in combination with an HPV test. Other tests  STD (sexually transmitted disease) testing, if you are at risk.  Bone density scan. This is done to screen for osteoporosis. You may have this scan if you are at high risk for osteoporosis. Talk with your health care provider about your test results, treatment options, and if necessary, the  need for more tests. Follow these instructions at home: Eating and drinking  Eat a diet that includes fresh fruits and vegetables, whole grains, lean protein, and low-fat dairy products.  Take vitamin and mineral supplements as recommended by your health care provider.  Do not drink alcohol if: ? Your health care provider tells you not to drink. ? You are pregnant, may be pregnant, or are planning to become pregnant.  If you  drink alcohol: ? Limit how much you have to 0-1 drink a day. ? Be aware of how much alcohol is in your drink. In the [place] one drink equals one 12 oz bottle of beer (355 mL), one 5 oz glass of wine (148 mL), or one 1 oz glass of hard liquor (44 mL).   Lifestyle  Take daily care of your teeth and gums. Brush your teeth every morning and night with fluoride toothpaste. Floss one time each day.  Stay active. Exercise for at least 30 minutes 5 or more days each week.  Do not use any products that contain nicotine or tobacco, such as cigarettes, e-cigarettes, and chewing tobacco. If you need help quitting, ask your health care provider.  Do not use drugs.  If you are sexually active, practice safe sex. Use a condom or other form of protection to prevent STIs (sexually transmitted infections).  If you do not wish to become pregnant, use a form of birth control. If you plan to become pregnant, see your health care provider for a prepregnancy visit.  If told by your health care provider, take low-dose aspirin daily starting at age 54.  Find healthy ways to cope with stress, such as: ? Meditation, yoga, or listening to music. ? Journaling. ? Talking to a trusted person. ? Spending time with friends and family. Safety  Always wear your seat belt while driving or riding in a vehicle.  Do not drive: ? If you have been drinking alcohol. Do not ride with someone who has been drinking. ? When you are tired or distracted. ? While texting.  Wear a helmet and other protective equipment during sports activities.  If you have firearms in your house, make sure you follow all gun safety procedures. What's next?  Visit your health care provider once a year for an annual wellness visit.  Ask your health care provider how often you should have your eyes and teeth checked.  Stay up to date on all vaccines. This information is not intended to replace advice given to you by your health care provider.  Make sure you discuss any questions you have with your health care provider. Document Revised: 08/24/2020 Document Reviewed: 08/01/2018 Elsevier Patient Education  2021 [employer].

## 2021-01-28 NOTE — Assessment & Plan Note (Addendum)
Repeat lipid panel: Abnormal lipid panel: your 83yrs risk of cardiovascular disease is 7%. It id important to maintain DASH diet and daily exercise to help improve this number. Repeat in 33months fasting

## 2021-01-28 NOTE — Assessment & Plan Note (Addendum)
Current use of metformin only Does not check glucose at home Normal foot exam Upcoming eye exam appt. She is to schedule dental cleaning appt. BP at goal Pneumovax completed today Advised about the importance to DASH diet and regular exercise Repeat BMP, HgbA1c and urine microalbumin: hgbA1c at 8.2. increase metformin to 1000mg  BID Positive protein in urine due to uncontrolled DM. Adding los dose lisinopril to help protect the kidneys. Abnormal lipid panel: your 48yrs risk of cardiovascular disease is 7%. It id important to maintain DASH diet and daily exercise to help improve this number. F/up in 61months

## 2021-01-28 NOTE — Assessment & Plan Note (Addendum)
Stable with use of omeprazole 20mg  daily No melena, no weight loss, no dysphagia Refill sent

## 2021-01-28 NOTE — Telephone Encounter (Signed)
Pt called and said the pharmacy said they didn't receive a refill for Prilosec, please advise

## 2021-01-28 NOTE — Telephone Encounter (Signed)
Rx sent 
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
## 2021-01-28 NOTE — Assessment & Plan Note (Signed)
Chronic, waxing and waning,  rash on neck,chest and forearm. Refilled triamcinolone cream

## 2021-01-29 MED FILL — METFORMIN HCL 1000 MG TABS: 1000 | 90 days supply | Qty: 180 | Fill #0

## 2021-01-29 MED FILL — LISINOPRIL 2.5 MG TABLET: 2.5 | 90 days supply | Qty: 90 | Fill #0

## 2021-05-09 ENCOUNTER — Other Ambulatory Visit: Payer: Self-pay | Admitting: Nurse Practitioner

## 2021-05-09 ENCOUNTER — Other Ambulatory Visit (HOSPITAL_COMMUNITY): Payer: Self-pay

## 2021-05-09 DIAGNOSIS — L309 Dermatitis, unspecified: Secondary | ICD-10-CM

## 2021-05-09 MED FILL — Omeprazole Cap Delayed Release 20 MG: ORAL | 90 days supply | Qty: 90 | Fill #0 | Status: AC

## 2021-05-09 MED FILL — Lisinopril Tab 2.5 MG: ORAL | 90 days supply | Qty: 90 | Fill #0 | Status: AC

## 2021-05-10 ENCOUNTER — Other Ambulatory Visit (HOSPITAL_COMMUNITY): Payer: Self-pay

## 2021-05-11 ENCOUNTER — Other Ambulatory Visit (HOSPITAL_COMMUNITY): Payer: Self-pay

## 2021-05-11 ENCOUNTER — Other Ambulatory Visit: Payer: Self-pay | Admitting: Nurse Practitioner

## 2021-05-11 DIAGNOSIS — L309 Dermatitis, unspecified: Secondary | ICD-10-CM

## 2021-05-16 ENCOUNTER — Encounter: Payer: Self-pay | Admitting: Nurse Practitioner

## 2021-05-16 DIAGNOSIS — L309 Dermatitis, unspecified: Secondary | ICD-10-CM

## 2021-05-17 ENCOUNTER — Other Ambulatory Visit (HOSPITAL_COMMUNITY): Payer: Self-pay

## 2021-05-17 MED ORDER — TRIAMCINOLONE ACETONIDE 0.1 % EX CREA
TOPICAL_CREAM | Freq: Two times a day (BID) | CUTANEOUS | 0 refills | Status: DC
  Filled 2021-05-17: qty 454, 30d supply, fill #0

## 2021-05-19 ENCOUNTER — Other Ambulatory Visit (HOSPITAL_COMMUNITY): Payer: Self-pay

## 2021-07-20 ENCOUNTER — Other Ambulatory Visit (HOSPITAL_COMMUNITY): Payer: Self-pay

## 2021-07-20 MED FILL — Metformin HCl Tab 1000 MG: ORAL | 90 days supply | Qty: 180 | Fill #0 | Status: AC

## 2021-08-15 ENCOUNTER — Other Ambulatory Visit (HOSPITAL_COMMUNITY): Payer: Self-pay

## 2021-08-15 MED FILL — Lisinopril Tab 2.5 MG: ORAL | 90 days supply | Qty: 90 | Fill #1 | Status: AC

## 2021-08-22 ENCOUNTER — Other Ambulatory Visit (HOSPITAL_COMMUNITY): Payer: Self-pay

## 2021-08-22 MED FILL — Omeprazole Cap Delayed Release 20 MG: ORAL | 90 days supply | Qty: 90 | Fill #1 | Status: AC

## 2021-10-21 ENCOUNTER — Other Ambulatory Visit (HOSPITAL_COMMUNITY): Payer: Self-pay

## 2021-10-21 MED ORDER — SHINGRIX 50 MCG/0.5ML IM SUSR
0.5000 mL | INTRAMUSCULAR | 1 refills | Status: DC
  Filled 2021-10-21 – 2021-10-28 (×2): qty 0.5, 1d supply, fill #0
  Filled 2022-01-04: qty 0.5, 1d supply, fill #1

## 2021-10-28 ENCOUNTER — Other Ambulatory Visit (HOSPITAL_COMMUNITY): Payer: Self-pay

## 2021-10-31 ENCOUNTER — Other Ambulatory Visit (HOSPITAL_COMMUNITY): Payer: Self-pay

## 2021-12-05 ENCOUNTER — Other Ambulatory Visit (HOSPITAL_COMMUNITY): Payer: Self-pay

## 2021-12-05 MED FILL — Lisinopril Tab 2.5 MG: ORAL | 90 days supply | Qty: 90 | Fill #2 | Status: AC

## 2021-12-14 ENCOUNTER — Other Ambulatory Visit (HOSPITAL_COMMUNITY): Payer: Self-pay

## 2021-12-14 MED FILL — Omeprazole Cap Delayed Release 20 MG: ORAL | 90 days supply | Qty: 90 | Fill #2 | Status: AC

## 2022-01-04 ENCOUNTER — Other Ambulatory Visit (HOSPITAL_COMMUNITY): Payer: Self-pay

## 2022-02-08 ENCOUNTER — Other Ambulatory Visit (HOSPITAL_COMMUNITY): Payer: Self-pay

## 2022-02-08 ENCOUNTER — Other Ambulatory Visit: Payer: Self-pay | Admitting: Nurse Practitioner

## 2022-02-08 DIAGNOSIS — E1165 Type 2 diabetes mellitus with hyperglycemia: Secondary | ICD-10-CM

## 2022-02-09 ENCOUNTER — Other Ambulatory Visit (HOSPITAL_COMMUNITY): Payer: Self-pay

## 2022-02-09 ENCOUNTER — Other Ambulatory Visit: Payer: Self-pay | Admitting: Nurse Practitioner

## 2022-02-09 DIAGNOSIS — E1165 Type 2 diabetes mellitus with hyperglycemia: Secondary | ICD-10-CM

## 2022-02-09 MED ORDER — METFORMIN HCL 1000 MG PO TABS
1000.0000 mg | ORAL_TABLET | Freq: Two times a day (BID) | ORAL | 0 refills | Status: DC
  Filled 2022-02-09: qty 180, 90d supply, fill #0

## 2022-02-09 NOTE — Telephone Encounter (Signed)
Caller Name: Talyia Allende  ?Call back phone #: [phone] ? ?MEDICATION(S): metformin ? ? ?Has the patient contacted their pharmacy (YES/NO)?  yes ?IF YES, when and what did the pharmacy advise? Requesting refill to get her to her physical  ?IF NO, request that the patient contact the pharmacy for the refills in the future.  ?           The pharmacy will send an electronic request (except for controlled medications). ? ? ? ?~~~Please advise patient/caregiver to allow 2-3 business days to process RX refills.  ?

## 2022-03-07 ENCOUNTER — Other Ambulatory Visit: Payer: Self-pay | Admitting: Nurse Practitioner

## 2022-03-07 DIAGNOSIS — Z1231 Encounter for screening mammogram for malignant neoplasm of breast: Secondary | ICD-10-CM

## 2022-03-14 ENCOUNTER — Ambulatory Visit
Admission: RE | Admit: 2022-03-14 | Discharge: 2022-03-14 | Disposition: A | Discharge status: Home / Self Care | Payer: 59 | Source: Ambulatory Visit | Attending: Nurse Practitioner | Admitting: Nurse Practitioner

## 2022-03-14 DIAGNOSIS — Z1231 Encounter for screening mammogram for malignant neoplasm of breast: Secondary | ICD-10-CM

## 2022-03-24 ENCOUNTER — Other Ambulatory Visit (HOSPITAL_COMMUNITY): Payer: Self-pay

## 2022-03-24 ENCOUNTER — Ambulatory Visit (INDEPENDENT_AMBULATORY_CARE_PROVIDER_SITE_OTHER): Payer: 59 | Admitting: Nurse Practitioner

## 2022-03-24 ENCOUNTER — Encounter: Payer: Self-pay | Admitting: Nurse Practitioner

## 2022-03-24 VITALS — BP 122/90 | HR 83 | Temp 97.6°F | Ht 70.0 in | Wt 218.6 lb

## 2022-03-24 DIAGNOSIS — E1165 Type 2 diabetes mellitus with hyperglycemia: Secondary | ICD-10-CM | POA: Diagnosis not present

## 2022-03-24 DIAGNOSIS — K219 Gastro-esophageal reflux disease without esophagitis: Secondary | ICD-10-CM | POA: Diagnosis not present

## 2022-03-24 DIAGNOSIS — L309 Dermatitis, unspecified: Secondary | ICD-10-CM | POA: Diagnosis not present

## 2022-03-24 DIAGNOSIS — E7800 Pure hypercholesterolemia, unspecified: Secondary | ICD-10-CM

## 2022-03-24 DIAGNOSIS — R0602 Shortness of breath: Secondary | ICD-10-CM | POA: Insufficient documentation

## 2022-03-24 LAB — POCT GLYCOSYLATED HEMOGLOBIN (HGB A1C)
HbA1c POC (<> result, manual entry): 7.4 % (ref 4.0–5.6)
HbA1c, POC (controlled diabetic range): 7.4 % — AB (ref 0.0–7.0)
HbA1c, POC (prediabetic range): 7.4 % — AB (ref 5.7–6.4)
Hemoglobin A1C: 7.4 % — AB (ref 4.0–5.6)

## 2022-03-24 MED ORDER — OMEPRAZOLE 20 MG PO CPDR
20.0000 mg | DELAYED_RELEASE_CAPSULE | Freq: Every day | ORAL | 3 refills | Status: DC
  Filled 2022-03-24: qty 90, 90d supply, fill #0
  Filled 2022-07-17: qty 90, 90d supply, fill #1
  Filled 2022-10-10: qty 90, 90d supply, fill #2
  Filled 2023-03-02: qty 90, 90d supply, fill #3

## 2022-03-24 MED ORDER — GLIPIZIDE ER 2.5 MG PO TB24
2.5000 mg | ORAL_TABLET | Freq: Every day | ORAL | 1 refills | Status: DC
  Filled 2022-03-24: qty 90, 90d supply, fill #0
  Filled 2022-06-26: qty 90, 90d supply, fill #1

## 2022-03-24 MED ORDER — METFORMIN HCL ER (OSM) 500 MG PO TB24
500.0000 mg | ORAL_TABLET | Freq: Every day | ORAL | 1 refills | Status: DC
  Filled 2022-03-24: qty 90, 90d supply, fill #0

## 2022-03-24 MED ORDER — LISINOPRIL 2.5 MG PO TABS
2.5000 mg | ORAL_TABLET | Freq: Every day | ORAL | 3 refills | Status: DC
  Filled 2022-03-24: qty 90, 90d supply, fill #0
  Filled 2022-07-06: qty 90, 90d supply, fill #1
  Filled 2022-10-10: qty 90, 90d supply, fill #2

## 2022-03-24 MED ORDER — METFORMIN HCL ER 500 MG PO TB24
500.0000 mg | ORAL_TABLET | Freq: Every day | ORAL | 1 refills | Status: DC
  Filled 2022-03-24: qty 90, 90d supply, fill #0
  Filled 2022-07-06: qty 90, 90d supply, fill #1

## 2022-03-24 MED ORDER — TRIAMCINOLONE ACETONIDE 0.1 % EX CREA
TOPICAL_CREAM | Freq: Two times a day (BID) | CUTANEOUS | 0 refills | Status: DC
  Filled 2022-03-24: qty 454, 30d supply, fill #0

## 2022-03-24 NOTE — Assessment & Plan Note (Signed)
Stable with omeprazole daily ?Refill sent ?

## 2022-03-24 NOTE — Patient Instructions (Addendum)
Schedule lab appt for fasting blood draw. ?Need to be fasting 8hrs prior to blood draw. ? ?hgbA1c at 7.5% ?Start glipizide 2.5mg  daily and metformin 500mg  XR daily. ?F/up in 50months ? ? ?

## 2022-03-24 NOTE — Progress Notes (Signed)
? ?             Established Patient Visit ? ?Patient: Amy [name]   DOB: 1966-06-10   56 y.o. Female  MRN: [id number] ?Visit Date: 03/24/2022 ? ?Subjective:  ?  ?Chief Complaint  ?Patient presents with  ? Annual Exam  ?  CPE. Discuses labs and metformin is cause diarrhea. Rx refill on Lisinopril  ? ?HPI ?Type 2 diabetes mellitus with hyperglycemia, without long-term current use of insulin ([place]) ?Uncontrolled with hgbA1c at 7.4%. ?No glucose check at home. ?Reports severe diarrhea with current metformin dose.(1043m BID). ?Lisinopril 2.5355mfor renal protection ?Normal foot exam today. ?BP Readings from Last 3 Encounters:  ?03/24/22 122/90  ?01/28/21 128/88  ?12/23/19 102/68  ?Schedule appt for diabetic eye exam ?Add glipizide 2.55m63mR daily ?Change to metformin 500m255m ?Check CMP, lipid panel and urine microalbumin ?F/up in 3mon80monthAcid reflux ?Stable with omeprazole daily ?Refill sent ? ?Pure hypercholesterolemia ?Repeat lipid panel ? ?Wt Readings from Last 3 Encounters:  ?03/24/22 218 lb 9.6 oz (99.2 kg)  ?01/28/21 225 lb 6.4 oz (102.2 kg)  ?12/23/19 227 lb 9.6 oz (103.2 kg)  ?  ?BP Readings from Last 3 Encounters:  ?03/24/22 122/90  ?01/28/21 128/88  ?12/23/19 102/68  ?  ? ?Reviewed medical, surgical, and social history today ? ?Medications: ?Outpatient Medications Prior to Visit  ?Medication Sig  ? blood glucose meter kit and supplies KIT Dispense based on patient and insurance preference. Use before breakfast. ICD 11.65  ? Fluticasone Propionate (FLONASE NA) Place into the nose. daily  ? FREESTYLE LITE test strip USE TO TEST BLOOD SUGAR DAILY BEFORE BREAKFAST  ? IBUPROFEN PO Take by mouth daily.  ? Loratadine (CLARITIN PO) Take by mouth. daily  ? [DISCONTINUED] metFORMIN (GLUCOPHAGE) 1000 MG tablet Take 1 tablet (1,000 mg total) by mouth 2 (two) times daily with a meal.  ? [DISCONTINUED] triamcinolone cream (KENALOG) 0.1 % Apply topically 2 (two) times daily to affected area.  ? [DISCONTINUED]  cyclobenzaprine (FLEXERIL) 5 MG tablet Take 1 tablet (5 mg total) by mouth at bedtime. (Patient not taking: Reported on 03/24/2022)  ? [DISCONTINUED] lisinopril (ZESTRIL) 2.5 MG tablet TAKE 1 TABLET (2.5 MG TOTAL) BY MOUTH DAILY.  ? [DISCONTINUED] omeprazole (PRILOSEC) 20 MG capsule TAKE 1 CAPSULE (20 MG TOTAL) BY MOUTH DAILY.  ? [DISCONTINUED] Zoster Vaccine Adjuvanted ([facility] Inject 0.5 mLs into the muscle.  ? ?No facility-administered medications prior to visit.  ? ?Reviewed past medical and social history.  ? ?ROS per HPI above ? ? ?   ?Objective:  ?BP 122/90 (BP Location: Left Arm, Patient Position: Sitting, Cuff Size: Normal)   Pulse 83   Temp 97.6 ?F (36.4 ?C) (Temporal)   Ht '5\' 10"'  (1.778 m)   Wt 218 lb 9.6 oz (99.2 kg)   LMP 06/04/2019   SpO2 97%   BMI 31.37 kg/m?  ? ?  ? ?Physical Exam ?Cardiovascular:  ?   Rate and Rhythm: Normal rate and regular rhythm.  ?   Pulses: Normal pulses.  ?   Heart sounds: Normal heart sounds.  ?Pulmonary:  ?   Effort: Pulmonary effort is normal.  ?   Breath sounds: Normal breath sounds.  ?Musculoskeletal:  ?   Right lower leg: No edema.  ?   Left lower leg: No edema.  ?Neurological:  ?   Mental Status: She is alert and oriented to person, place, and time.  ?  ?Results for orders placed or performed in visit  on 03/24/22  ?POCT glycosylated hemoglobin (Hb A1C)  ?Result Value Ref Range  ? Hemoglobin A1C 7.4 (A) 4.0 - 5.6 %  ? HbA1c POC (<> result, manual entry) 7.4 4.0 - 5.6 %  ? HbA1c, POC (prediabetic range) 7.4 (A) 5.7 - 6.4 %  ? HbA1c, POC (controlled diabetic range) 7.4 (A) 0.0 - 7.0 %  ? ?   ?Assessment & Plan:  ?  ?Problem List Items Addressed This Visit   ? ?  ? Digestive  ? Acid reflux  ?  Stable with omeprazole daily ?Refill sent ? ?  ?  ? Relevant Medications  ? omeprazole (PRILOSEC) 20 MG capsule  ?  ? Endocrine  ? Type 2 diabetes mellitus with hyperglycemia, without long-term current use of insulin (HCC) - Primary  ?  Uncontrolled with hgbA1c at  7.4%. ?No glucose check at home. ?Reports severe diarrhea with current metformin dose.(10139m BID). ?Lisinopril 2.51mfor renal protection ?Normal foot exam today. ?BP Readings from Last 3 Encounters:  ?03/24/22 122/90  ?01/28/21 128/88  ?12/23/19 102/68  ?Schedule appt for diabetic eye exam ?Add glipizide 2.39m13mR daily ?Change to metformin 500m74m ?Check CMP, lipid panel and urine microalbumin ?F/up in 3mon18month?  ? Relevant Medications  ? lisinopril (ZESTRIL) 2.5 MG tablet  ? glipiZIDE (GLUCOTROL XL) 2.5 MG 24 hr tablet  ? metFORMIN (GLUCOPHAGE-XR) 500 MG 24 hr tablet  ? Other Relevant Orders  ? POCT glycosylated hemoglobin (Hb A1C) (Completed)  ? Comprehensive metabolic panel  ? Microalbumin / creatinine urine ratio  ?  ? Musculoskeletal and Integument  ? Eczema  ? Relevant Medications  ? triamcinolone cream (KENALOG) 0.1 %  ?  ? Other  ? Pure hypercholesterolemia  ?  Repeat lipid panel ? ?  ?  ? Relevant Medications  ? lisinopril (ZESTRIL) 2.5 MG tablet  ? Other Relevant Orders  ? Lipid panel  ? TSH  ? ?Return in about 3 months (around 06/23/2022) for CPE (fasting). ? ?  ? ?CharlWilfred Lacy? ? ? ?

## 2022-03-24 NOTE — Assessment & Plan Note (Signed)
Repeat lipid panel ?

## 2022-03-24 NOTE — Assessment & Plan Note (Addendum)
Uncontrolled with hgbA1c at 7.4%. ?No glucose check at home. ?Reports severe diarrhea with current metformin dose.(1000mg  BID). ?Lisinopril 2.5mg  for renal protection ?Normal foot exam today. ?BP Readings from Last 3 Encounters:  ?03/24/22 122/90  ?01/28/21 128/88  ?12/23/19 102/68  ?Schedule appt for diabetic eye exam ?Add glipizide 2.5mg  XR daily ?Change to metformin 500mg  XR ?Check CMP, lipid panel and urine microalbumin ?F/up in 42months ?

## 2022-04-14 DIAGNOSIS — H10212 Acute toxic conjunctivitis, left eye: Secondary | ICD-10-CM | POA: Diagnosis not present

## 2022-04-21 DIAGNOSIS — H524 Presbyopia: Secondary | ICD-10-CM | POA: Diagnosis not present

## 2022-04-21 LAB — HM DIABETES EYE EXAM

## 2022-06-26 ENCOUNTER — Other Ambulatory Visit (HOSPITAL_COMMUNITY): Payer: Self-pay

## 2022-07-06 ENCOUNTER — Other Ambulatory Visit (HOSPITAL_COMMUNITY): Payer: Self-pay

## 2022-07-17 ENCOUNTER — Other Ambulatory Visit (HOSPITAL_COMMUNITY): Payer: Self-pay

## 2022-09-26 ENCOUNTER — Other Ambulatory Visit: Payer: Self-pay | Admitting: Nurse Practitioner

## 2022-09-26 ENCOUNTER — Other Ambulatory Visit (HOSPITAL_COMMUNITY): Payer: Self-pay

## 2022-09-26 DIAGNOSIS — E1165 Type 2 diabetes mellitus with hyperglycemia: Secondary | ICD-10-CM

## 2022-09-27 ENCOUNTER — Other Ambulatory Visit (HOSPITAL_COMMUNITY): Payer: Self-pay

## 2022-10-09 ENCOUNTER — Telehealth: Payer: Self-pay | Admitting: Nurse Practitioner

## 2022-10-09 DIAGNOSIS — E1165 Type 2 diabetes mellitus with hyperglycemia: Secondary | ICD-10-CM

## 2022-10-09 NOTE — Telephone Encounter (Signed)
Pt called and want to know if you can prescribe enough diabetes medication until see come to her next scheduled appt with you

## 2022-10-10 ENCOUNTER — Other Ambulatory Visit (HOSPITAL_COMMUNITY): Payer: Self-pay

## 2022-10-10 MED ORDER — GLIPIZIDE ER 2.5 MG PO TB24
2.5000 mg | ORAL_TABLET | Freq: Every day | ORAL | 0 refills | Status: DC
  Filled 2022-10-10: qty 30, 30d supply, fill #0

## 2022-10-10 MED ORDER — METFORMIN HCL ER 500 MG PO TB24
500.0000 mg | ORAL_TABLET | Freq: Every day | ORAL | 0 refills | Status: DC
  Filled 2022-10-10: qty 30, 30d supply, fill #0

## 2022-10-10 NOTE — Telephone Encounter (Signed)
Pt has been notified the medications have been refilled on a 30 day supply, pt was informed that in order to receive any other refills she will have to keep upcoming appointment. Pt verbalized understanding.

## 2022-10-25 ENCOUNTER — Ambulatory Visit: Payer: 59 | Admitting: Nurse Practitioner

## 2022-10-25 ENCOUNTER — Other Ambulatory Visit (HOSPITAL_COMMUNITY): Payer: Self-pay

## 2022-10-25 ENCOUNTER — Encounter: Payer: Self-pay | Admitting: Nurse Practitioner

## 2022-10-25 VITALS — BP 116/80 | HR 101 | Temp 97.3°F | Ht 70.0 in | Wt 216.0 lb

## 2022-10-25 DIAGNOSIS — R7989 Other specified abnormal findings of blood chemistry: Secondary | ICD-10-CM | POA: Diagnosis not present

## 2022-10-25 DIAGNOSIS — E1165 Type 2 diabetes mellitus with hyperglycemia: Secondary | ICD-10-CM

## 2022-10-25 DIAGNOSIS — E7800 Pure hypercholesterolemia, unspecified: Secondary | ICD-10-CM | POA: Diagnosis not present

## 2022-10-25 MED ORDER — FREESTYLE LITE TEST VI STRP
ORAL_STRIP | 3 refills | Status: AC
  Filled 2022-10-25: qty 100, 90d supply, fill #0

## 2022-10-25 MED ORDER — FREESTYLE LANCETS MISC
3 refills | Status: AC
  Filled 2022-10-25: qty 100, 90d supply, fill #0

## 2022-10-25 NOTE — Progress Notes (Signed)
Established Patient Visit  Patient: Amy [name]   DOB: 1966-07-09   56 y.o. Female  MRN: [id number] Visit Date: 10/30/2022  Subjective:    Chief Complaint  Patient presents with   Office Visit    Meciation refills, Metformin, glipizide, needles & lancets Pt fasting  No concerns    HPI Type 2 diabetes mellitus with hyperglycemia, without long-term current use of insulin (HCC) No glucose check at home Normal foot exam Negative DM retinopathy. BP at goal.  Repeat hgbA1c CMP and urine microalbumin: not at goal Increase glipizide to 55m daily Maintain metformin dose F/up in 365month Pure hypercholesterolemia Repeat lipid panel: abnormal Advised about importance of low carb/low fat diet Repeat labs in 26m47monthfasting)  Wt Readings from Last 3 Encounters:  10/25/22 216 lb (98 kg)  03/24/22 218 lb 9.6 oz (99.2 kg)  01/28/21 225 lb 6.4 oz (102.2 kg)    Reviewed medical, surgical, and social history today  Medications: Outpatient Medications Prior to Visit  Medication Sig   blood glucose meter kit and supplies KIT Dispense based on patient and insurance preference. Use before breakfast. ICD 11.65   Fluticasone Propionate (FLONASE NA) Place into the nose. daily   IBUPROFEN PO Take by mouth daily.   Loratadine (CLARITIN PO) Take by mouth. daily   omeprazole (PRILOSEC) 20 MG capsule Take 1 capsule (20 mg total) by mouth daily.   triamcinolone cream (KENALOG) 0.1 % Apply topically to affected area 2 (two) times daily   [DISCONTINUED] FREESTYLE LITE test strip USE TO TEST BLOOD SUGAR DAILY BEFORE BREAKFAST   [DISCONTINUED] glipiZIDE (GLUCOTROL XL) 2.5 MG 24 hr tablet Take 1 tablet (2.5 mg total) by mouth daily with breakfast. No additional refill without office visit   [DISCONTINUED] lisinopril (ZESTRIL) 2.5 MG tablet Take 1 tablet (2.5 mg total) by mouth daily.   [DISCONTINUED] metFORMIN (GLUCOPHAGE-XR) 500 MG 24 hr tablet Take 1 tablet (500 mg total) by mouth  daily with breakfast. No additional refill without office visit   No facility-administered medications prior to visit.   Reviewed past medical and social history.   ROS per HPI above      Objective:  BP 116/80 (BP Location: Right Arm, Patient Position: Sitting, Cuff Size: Normal)   Pulse (!) 101   Temp (!) 97.3 F (36.3 C) (Temporal)   Ht 5' 10" (1.778 m)   Wt 216 lb (98 kg)   LMP 06/04/2019   SpO2 98%   BMI 30.99 kg/m      Physical Exam Constitutional:      Appearance: She is obese.  Cardiovascular:     Rate and Rhythm: Normal rate and regular rhythm.     Pulses: Normal pulses.     Heart sounds: Normal heart sounds.  Pulmonary:     Effort: Pulmonary effort is normal.     Breath sounds: Normal breath sounds.  Musculoskeletal:     Right lower leg: No edema.     Left lower leg: No edema.  Neurological:     Mental Status: She is alert and oriented to person, place, and time.     Results for orders placed or performed in visit on 10/25/22  Comprehensive metabolic panel  Result Value Ref Range   Glucose, Bld 103 (H) 65 - 99 mg/dL   BUN 18 7 - 25 mg/dL   Creat 0.58 0.50 - 1.03 mg/dL   BUN/Creatinine Ratio SEE NOTE: 6 -  22 (calc)   Sodium 140 135 - 146 mmol/L   Potassium 4.3 3.5 - 5.3 mmol/L   Chloride 102 98 - 110 mmol/L   CO2 27 20 - 32 mmol/L   Calcium 10.1 8.6 - 10.4 mg/dL   Total Protein 9.0 (H) 6.1 - 8.1 g/dL   Albumin 4.6 3.6 - 5.1 g/dL   Globulin 4.4 (H) 1.9 - 3.7 g/dL (calc)   AG Ratio 1.0 1.0 - 2.5 (calc)   Total Bilirubin 0.4 0.2 - 1.2 mg/dL   Alkaline phosphatase (APISO) 103 37 - 153 U/L   AST 38 (H) 10 - 35 U/L   ALT 42 (H) 6 - 29 U/L  Hemoglobin A1c  Result Value Ref Range   Hgb A1c MFr Bld 7.8 (H) <5.7 % of total Hgb   Mean Plasma Glucose 177 mg/dL   eAG (mmol/L) 9.8 mmol/L  Lipid panel  Result Value Ref Range   Cholesterol 224 (H) <200 mg/dL   HDL 53 > OR = 50 mg/dL   Triglycerides 78 <150 mg/dL   LDL Cholesterol (Calc) 153 (H) mg/dL  (calc)   Total CHOL/HDL Ratio 4.2 <5.0 (calc)   Non-HDL Cholesterol (Calc) 171 (H) <130 mg/dL (calc)  Microalbumin / creatinine urine ratio  Result Value Ref Range   Creatinine, Urine 334 (H) 20 - 275 mg/dL   Microalb, Ur 2.6 mg/dL   Microalb Creat Ratio 8 <30 mcg/mg creat  TSH  Result Value Ref Range   TSH 1.32 mIU/L      Assessment & Plan:    Problem List Items Addressed This Visit       Endocrine   Type 2 diabetes mellitus with hyperglycemia, without long-term current use of insulin ([facility]CC) - Primary    No glucose check at home Normal foot exam Negative DM retinopathy. BP at goal.  Repeat hgbA1c CMP and urine microalbumin: not at goal Increase glipizide to 61m daily Maintain metformin dose F/up in 3756month     Relevant Medications   glucose blood (FREESTYLE LITE) test strip   Lancets (FREESTYLE) lancets   glipiZIDE (GLUCOTROL XL) 5 MG 24 hr tablet   metFORMIN (GLUCOPHAGE-XR) 500 MG 24 hr tablet   lisinopril (ZESTRIL) 2.5 MG tablet   Other Relevant Orders   Comprehensive metabolic panel (Completed)   Hemoglobin A1c (Completed)   Microalbumin / creatinine urine ratio (Completed)     Other   Pure hypercholesterolemia    Repeat lipid panel: abnormal Advised about importance of low carb/low fat diet Repeat labs in 56m95monthfasting)      Relevant Medications   lisinopril (ZESTRIL) 2.5 MG tablet   Other Relevant Orders   Comprehensive metabolic panel (Completed)   Lipid panel (Completed)   TSH (Completed)   Other Visit Diagnoses     Elevated LFTs       Relevant Orders   Hepatic function panel      Return in about 3 months (around 01/25/2023) for CPE (fasting, breast and pelvic exam).     ChaWilfred LacyP

## 2022-10-25 NOTE — Patient Instructions (Signed)
Go to lab F/up in 17months for CPE (fasting)

## 2022-10-25 NOTE — Assessment & Plan Note (Addendum)
No glucose check at home Normal foot exam Negative DM retinopathy. BP at goal.  Repeat hgbA1c CMP and urine microalbumin: not at goal Increase glipizide to 5mg  daily Maintain metformin dose F/up in 81months

## 2022-10-25 NOTE — Assessment & Plan Note (Addendum)
Repeat lipid panel: abnormal Advised about importance of low carb/low fat diet Repeat labs in 37months (fasting)

## 2022-10-26 LAB — LIPID PANEL
Cholesterol: 224 mg/dL — ABNORMAL HIGH (ref <200)
HDL: 53 mg/dL (ref >=50)
LDL Cholesterol (Calc): 153 mg/dL (calc) — ABNORMAL HIGH
Non-HDL Cholesterol (Calc): 171 mg/dL (calc) — ABNORMAL HIGH (ref <130)
Total CHOL/HDL Ratio: 4.2 (calc) (ref <5.0)
Triglycerides: 78 mg/dL (ref <150)

## 2022-10-26 LAB — COMPREHENSIVE METABOLIC PANEL
AG Ratio: 1 (calc) (ref 1.0–2.5)
ALT: 42 U/L — ABNORMAL HIGH (ref 6–29)
AST: 38 U/L — ABNORMAL HIGH (ref 10–35)
Albumin: 4.6 g/dL (ref 3.6–5.1)
Alkaline phosphatase (APISO): 103 U/L (ref 37–153)
BUN: 18 mg/dL (ref 7–25)
CO2: 27 mmol/L (ref 20–32)
Calcium: 10.1 mg/dL (ref 8.6–10.4)
Chloride: 102 mmol/L (ref 98–110)
Creat: 0.58 mg/dL (ref 0.50–1.03)
Globulin: 4.4 g/dL (calc) — ABNORMAL HIGH (ref 1.9–3.7)
Glucose, Bld: 103 mg/dL — ABNORMAL HIGH (ref 65–99)
Potassium: 4.3 mmol/L (ref 3.5–5.3)
Sodium: 140 mmol/L (ref 135–146)
Total Bilirubin: 0.4 mg/dL (ref 0.2–1.2)
Total Protein: 9 g/dL — ABNORMAL HIGH (ref 6.1–8.1)

## 2022-10-26 LAB — MICROALBUMIN / CREATININE URINE RATIO
Creatinine, Urine: 334 mg/dL — ABNORMAL HIGH (ref 20–275)
Microalb Creat Ratio: 8 mcg/mg creat (ref <30)
Microalb, Ur: 2.6 mg/dL

## 2022-10-26 LAB — HEMOGLOBIN A1C
Hgb A1c MFr Bld: 7.8 % of total Hgb — ABNORMAL HIGH (ref <5.7)
Mean Plasma Glucose: 177 mg/dL
eAG (mmol/L): 9.8 mmol/L

## 2022-10-26 LAB — TSH: TSH: 1.32 mIU/L

## 2022-10-30 ENCOUNTER — Other Ambulatory Visit (HOSPITAL_COMMUNITY): Payer: Self-pay

## 2022-10-30 MED ORDER — METFORMIN HCL ER 500 MG PO TB24
500.0000 mg | ORAL_TABLET | Freq: Every day | ORAL | 1 refills | Status: DC
  Filled 2022-10-30 – 2022-11-03 (×2): qty 90, 90d supply, fill #0
  Filled 2023-02-19: qty 90, 90d supply, fill #1

## 2022-10-30 MED ORDER — LISINOPRIL 2.5 MG PO TABS
2.5000 mg | ORAL_TABLET | Freq: Every day | ORAL | 3 refills | Status: DC
  Filled 2022-10-30 – 2023-01-24 (×2): qty 90, 90d supply, fill #0

## 2022-10-30 MED ORDER — GLIPIZIDE ER 5 MG PO TB24
5.0000 mg | ORAL_TABLET | Freq: Every day | ORAL | 1 refills | Status: DC
  Filled 2022-10-30: qty 90, 90d supply, fill #0
  Filled 2023-01-24: qty 90, 90d supply, fill #1

## 2022-11-02 ENCOUNTER — Telehealth: Payer: Self-pay | Admitting: Nurse Practitioner

## 2022-11-02 NOTE — Telephone Encounter (Signed)
I called [phone] to speak with pt about survey response regarding CPE. Someone answered the phone but was not responding. Please transfer to Rosario Jacks if pt returns call.

## 2022-11-02 NOTE — Telephone Encounter (Signed)
Pt returned call approx 11:15a.  Advised pt that last CPE completed was 01/28/2021. Advised pt that appt 03/24/2022 was scheduled as a CPE but we were not able to complete or bill a CPE due to concerns with her diabetic medications during that visit. Pt was advised she is due for a CPE and can wait until April 2024 per provider.   Pt understood that CPE 01/28/2021 covers her for [facility] CPE completion for insurance plan 2023.  Pt had no further questions or concerns. She states she will call us back to schedule CPE for April 2024 once she has her work schedule.

## 2022-11-03 ENCOUNTER — Other Ambulatory Visit (HOSPITAL_COMMUNITY): Payer: Self-pay

## 2022-12-01 ENCOUNTER — Other Ambulatory Visit (INDEPENDENT_AMBULATORY_CARE_PROVIDER_SITE_OTHER): Payer: 59

## 2022-12-01 DIAGNOSIS — R7989 Other specified abnormal findings of blood chemistry: Secondary | ICD-10-CM | POA: Diagnosis not present

## 2022-12-01 LAB — HEPATIC FUNCTION PANEL
ALT: 27 U/L (ref 0–35)
AST: 24 U/L (ref 0–37)
Albumin: 4.1 g/dL (ref 3.5–5.2)
Alkaline Phosphatase: 73 U/L (ref 39–117)
Bilirubin, Direct: 0.1 mg/dL (ref 0.0–0.3)
Total Bilirubin: 0.4 mg/dL (ref 0.2–1.2)
Total Protein: 8 g/dL (ref 6.0–8.3)

## 2023-01-24 ENCOUNTER — Other Ambulatory Visit (HOSPITAL_COMMUNITY): Payer: Self-pay

## 2023-02-02 DIAGNOSIS — M25511 Pain in right shoulder: Secondary | ICD-10-CM | POA: Insufficient documentation

## 2023-02-19 ENCOUNTER — Other Ambulatory Visit (HOSPITAL_COMMUNITY): Payer: Self-pay

## 2023-03-02 ENCOUNTER — Other Ambulatory Visit (HOSPITAL_COMMUNITY): Payer: Self-pay

## 2023-04-03 ENCOUNTER — Encounter: Payer: 59 | Admitting: Nurse Practitioner

## 2023-05-07 ENCOUNTER — Other Ambulatory Visit: Payer: Self-pay | Admitting: Nurse Practitioner

## 2023-05-07 DIAGNOSIS — Z1231 Encounter for screening mammogram for malignant neoplasm of breast: Secondary | ICD-10-CM

## 2023-05-08 ENCOUNTER — Encounter: Payer: Self-pay | Admitting: Nurse Practitioner

## 2023-05-08 ENCOUNTER — Other Ambulatory Visit (HOSPITAL_COMMUNITY): Payer: Self-pay

## 2023-05-08 ENCOUNTER — Other Ambulatory Visit (HOSPITAL_COMMUNITY)
Admission: RE | Admit: 2023-05-08 | Discharge: 2023-05-08 | Disposition: A | Discharge status: Home / Self Care | Payer: 59 | Source: Ambulatory Visit | Attending: Nurse Practitioner | Admitting: Nurse Practitioner

## 2023-05-08 ENCOUNTER — Ambulatory Visit (INDEPENDENT_AMBULATORY_CARE_PROVIDER_SITE_OTHER): Payer: 59 | Admitting: Nurse Practitioner

## 2023-05-08 ENCOUNTER — Ambulatory Visit
Admission: RE | Admit: 2023-05-08 | Discharge: 2023-05-08 | Disposition: A | Discharge status: Home / Self Care | Payer: 59 | Source: Ambulatory Visit | Attending: Nurse Practitioner | Admitting: Nurse Practitioner

## 2023-05-08 VITALS — BP 116/84 | HR 80 | Temp 98.0°F | Resp 16 | Ht 70.0 in | Wt 217.4 lb

## 2023-05-08 DIAGNOSIS — Z124 Encounter for screening for malignant neoplasm of cervix: Secondary | ICD-10-CM | POA: Diagnosis not present

## 2023-05-08 DIAGNOSIS — M25511 Pain in right shoulder: Secondary | ICD-10-CM

## 2023-05-08 DIAGNOSIS — E1165 Type 2 diabetes mellitus with hyperglycemia: Secondary | ICD-10-CM | POA: Diagnosis not present

## 2023-05-08 DIAGNOSIS — E1169 Type 2 diabetes mellitus with other specified complication: Secondary | ICD-10-CM

## 2023-05-08 DIAGNOSIS — E785 Hyperlipidemia, unspecified: Secondary | ICD-10-CM | POA: Diagnosis not present

## 2023-05-08 DIAGNOSIS — Z7984 Long term (current) use of oral hypoglycemic drugs: Secondary | ICD-10-CM

## 2023-05-08 DIAGNOSIS — K219 Gastro-esophageal reflux disease without esophagitis: Secondary | ICD-10-CM | POA: Diagnosis not present

## 2023-05-08 DIAGNOSIS — G8929 Other chronic pain: Secondary | ICD-10-CM

## 2023-05-08 DIAGNOSIS — Z1231 Encounter for screening mammogram for malignant neoplasm of breast: Secondary | ICD-10-CM

## 2023-05-08 DIAGNOSIS — Z23 Encounter for immunization: Secondary | ICD-10-CM

## 2023-05-08 DIAGNOSIS — L309 Dermatitis, unspecified: Secondary | ICD-10-CM | POA: Diagnosis not present

## 2023-05-08 DIAGNOSIS — Z0000 Encounter for general adult medical examination without abnormal findings: Secondary | ICD-10-CM

## 2023-05-08 DIAGNOSIS — Z0001 Encounter for general adult medical examination with abnormal findings: Secondary | ICD-10-CM

## 2023-05-08 LAB — MICROALBUMIN / CREATININE URINE RATIO
Creatinine,U: 104.6 mg/dL
Microalb Creat Ratio: 1 mg/g (ref 0.0–30.0)
Microalb, Ur: 1 mg/dL (ref 0.0–1.9)

## 2023-05-08 LAB — COMPREHENSIVE METABOLIC PANEL
ALT: 21 U/L (ref 0–35)
AST: 21 U/L (ref 0–37)
Albumin: 4.2 g/dL (ref 3.5–5.2)
Alkaline Phosphatase: 79 U/L (ref 39–117)
BUN: 15 mg/dL (ref 6–23)
CO2: 27 mEq/L (ref 19–32)
Calcium: 9.3 mg/dL (ref 8.4–10.5)
Chloride: 103 mEq/L (ref 96–112)
Creatinine, Ser: 0.55 mg/dL (ref 0.40–1.20)
GFR: 103.01 mL/min (ref >60.00)
Glucose, Bld: 97 mg/dL (ref 70–99)
Potassium: 3.8 mEq/L (ref 3.5–5.1)
Sodium: 138 mEq/L (ref 135–145)
Total Bilirubin: 0.5 mg/dL (ref 0.2–1.2)
Total Protein: 8.1 g/dL (ref 6.0–8.3)

## 2023-05-08 LAB — LIPID PANEL
Cholesterol: 190 mg/dL (ref 0–200)
HDL: 49.8 mg/dL (ref >39.00)
LDL Cholesterol: 129 mg/dL — ABNORMAL HIGH (ref 0–99)
NonHDL: 140.37
Total CHOL/HDL Ratio: 4
Triglycerides: 59 mg/dL (ref 0.0–149.0)
VLDL: 11.8 mg/dL (ref 0.0–40.0)

## 2023-05-08 LAB — HEMOGLOBIN A1C: Hgb A1c MFr Bld: 6.6 % — ABNORMAL HIGH (ref 4.6–6.5)

## 2023-05-08 MED ORDER — TRIAMCINOLONE ACETONIDE 0.1 % EX CREA
TOPICAL_CREAM | Freq: Two times a day (BID) | CUTANEOUS | 0 refills | Status: DC
  Filled 2023-05-08: qty 454, 30d supply, fill #0

## 2023-05-08 MED ORDER — OMEPRAZOLE 20 MG PO CPDR
20.0000 mg | DELAYED_RELEASE_CAPSULE | Freq: Every day | ORAL | 1 refills | Status: DC
  Filled 2023-05-08 – 2023-06-25 (×2): qty 90, 90d supply, fill #0
  Filled 2023-10-05: qty 90, 90d supply, fill #1

## 2023-05-08 MED ORDER — GLIPIZIDE ER 5 MG PO TB24
5.0000 mg | ORAL_TABLET | Freq: Every day | ORAL | 1 refills | Status: DC
  Filled 2023-05-08: qty 90, 90d supply, fill #0
  Filled 2023-08-10: qty 90, 90d supply, fill #1

## 2023-05-08 MED ORDER — METFORMIN HCL ER 500 MG PO TB24
500.0000 mg | ORAL_TABLET | Freq: Every day | ORAL | 1 refills | Status: DC
  Filled 2023-05-08: qty 90, 90d supply, fill #0
  Filled 2023-08-20: qty 90, 90d supply, fill #1

## 2023-05-08 MED ORDER — LISINOPRIL 2.5 MG PO TABS
2.5000 mg | ORAL_TABLET | Freq: Every day | ORAL | 3 refills | Status: DC
  Filled 2023-05-08: qty 90, 90d supply, fill #0
  Filled 2023-08-10: qty 90, 90d supply, fill #1
  Filled 2023-11-19: qty 90, 90d supply, fill #2
  Filled 2024-03-18: qty 90, 90d supply, fill #3

## 2023-05-08 MED ORDER — DICLOFENAC SODIUM 1 % EX GEL
2.0000 g | Freq: Four times a day (QID) | CUTANEOUS | Status: DC
Start: 2023-05-08 — End: 2024-01-01

## 2023-05-08 NOTE — Assessment & Plan Note (Signed)
Chronic, waxing and waning, Worse with repetitive use at current job and cold weather. 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 home ROM exercise at this time Declined ref to ortho at this time
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
## 2023-05-08 NOTE — Assessment & Plan Note (Signed)
No glucose check at home Normal foot exam Advised to schedule DIABETES eye exam BP at goal.  Repeat hgbA1c CMP and urine microalbumin: not at goal Maintain metformin and glipizide doses F/up in 3months

## 2023-05-08 NOTE — Assessment & Plan Note (Signed)
Chronic, waxing and waning,  rash on neck,chest and forearm. Refilled triamcinolone cream 

## 2023-05-08 NOTE — Assessment & Plan Note (Signed)
Repeat lipid panel Advised about importance of low carb/low fat diet and daily exercise

## 2023-05-08 NOTE — Assessment & Plan Note (Signed)
Stable with omeprazole daily ?Refill sent ?

## 2023-05-09 ENCOUNTER — Other Ambulatory Visit (HOSPITAL_COMMUNITY): Payer: Self-pay

## 2023-05-09 ENCOUNTER — Telehealth: Payer: Self-pay | Admitting: Nurse Practitioner

## 2023-05-09 DIAGNOSIS — H65413 Chronic allergic otitis media, bilateral: Secondary | ICD-10-CM

## 2023-05-09 MED ORDER — HYDROCORTISONE-ACETIC ACID 1-2 % OT SOLN
3.0000 [drp] | Freq: Three times a day (TID) | OTIC | 0 refills | Status: DC
Start: 2023-05-09 — End: 2024-01-01
  Filled 2023-05-09: qty 10, 22d supply, fill #0
  Filled 2023-05-09: qty 10, 7d supply, fill #0

## 2023-05-09 NOTE — Telephone Encounter (Signed)
Pt stated that the ear drops that were discussed was not called in.

## 2023-05-11 LAB — CYTOLOGY - PAP
Comment: NEGATIVE
Diagnosis: NEGATIVE
High risk HPV: NEGATIVE

## 2023-06-25 ENCOUNTER — Other Ambulatory Visit (HOSPITAL_COMMUNITY): Payer: Self-pay

## 2023-08-10 ENCOUNTER — Other Ambulatory Visit (HOSPITAL_COMMUNITY): Payer: Self-pay

## 2023-08-20 ENCOUNTER — Other Ambulatory Visit (HOSPITAL_COMMUNITY): Payer: Self-pay

## 2023-10-05 ENCOUNTER — Other Ambulatory Visit (HOSPITAL_COMMUNITY): Payer: Self-pay

## 2023-11-04 IMAGING — MG MM DIGITAL SCREENING BILAT W/ TOMO AND CAD
8 series · 8 of 24 positions shown · non-contrast
Comparison: Previous exam(s).

CLINICAL DATA: Screening.

EXAM:
DIGITAL SCREENING BILATERAL MAMMOGRAM WITH TOMOSYNTHESIS AND CAD
TECHNIQUE: Bilateral screening digital craniocaudal and mediolateral oblique
mammograms were obtained. Bilateral screening digital breast
tomosynthesis was performed. The images were evaluated with
computer-aided detection.

[R CC synth-2D]
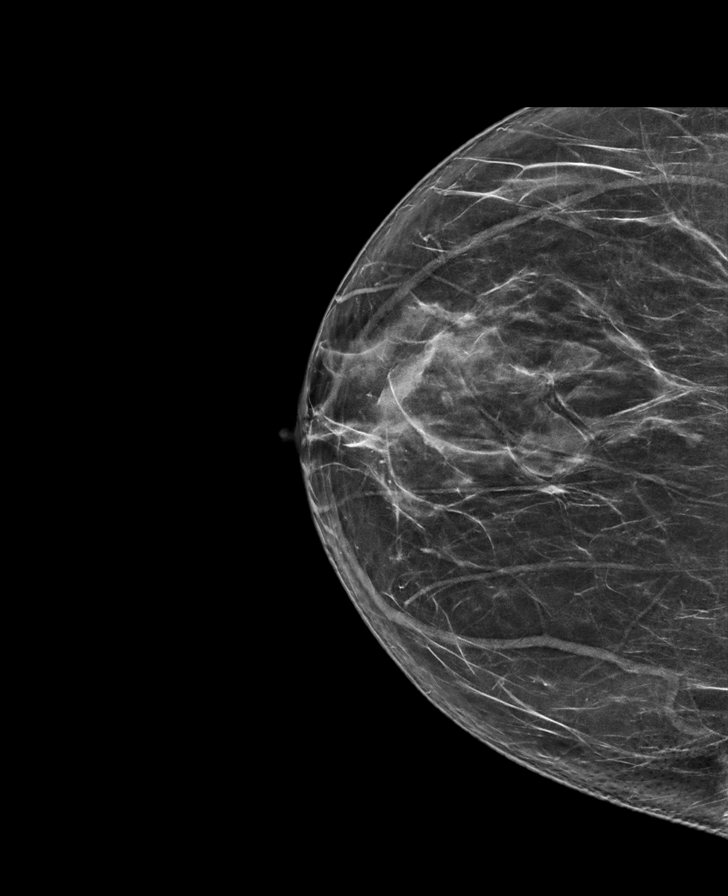

[L CC synth-2D]
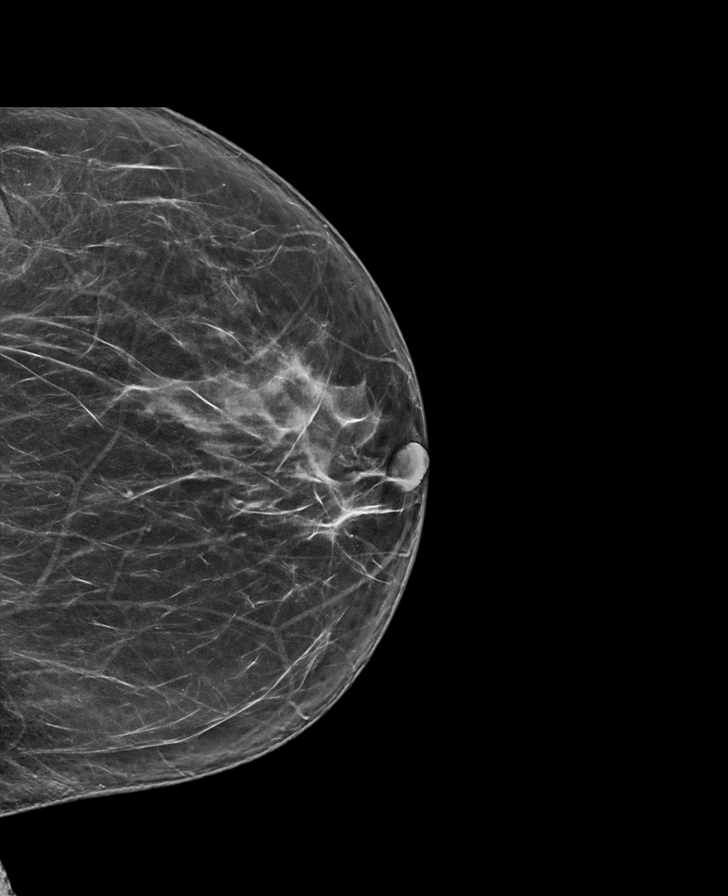

[L MLO synth-2D]
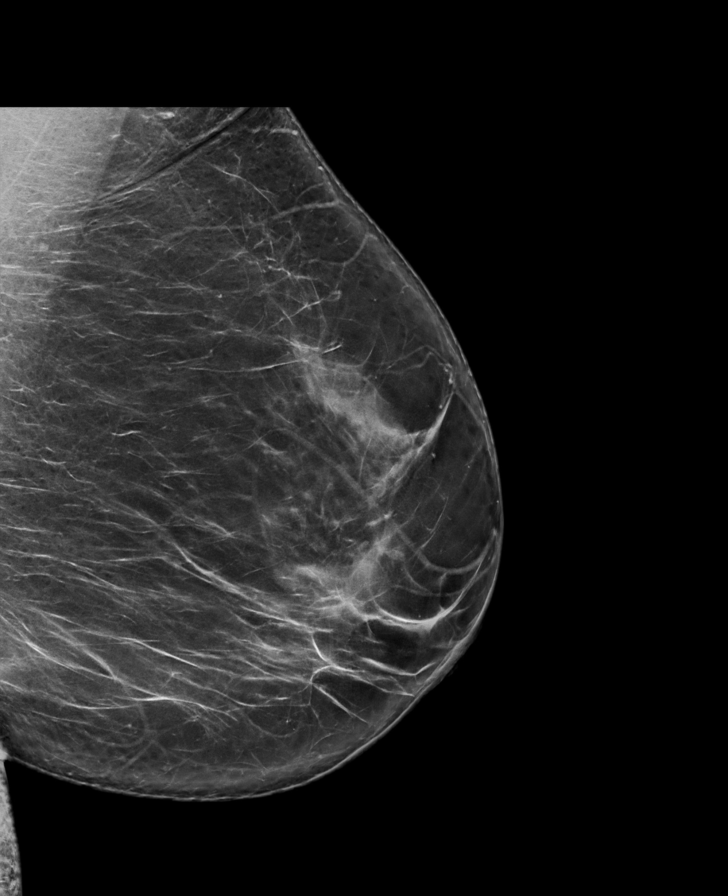

[R MLO synth-2D]
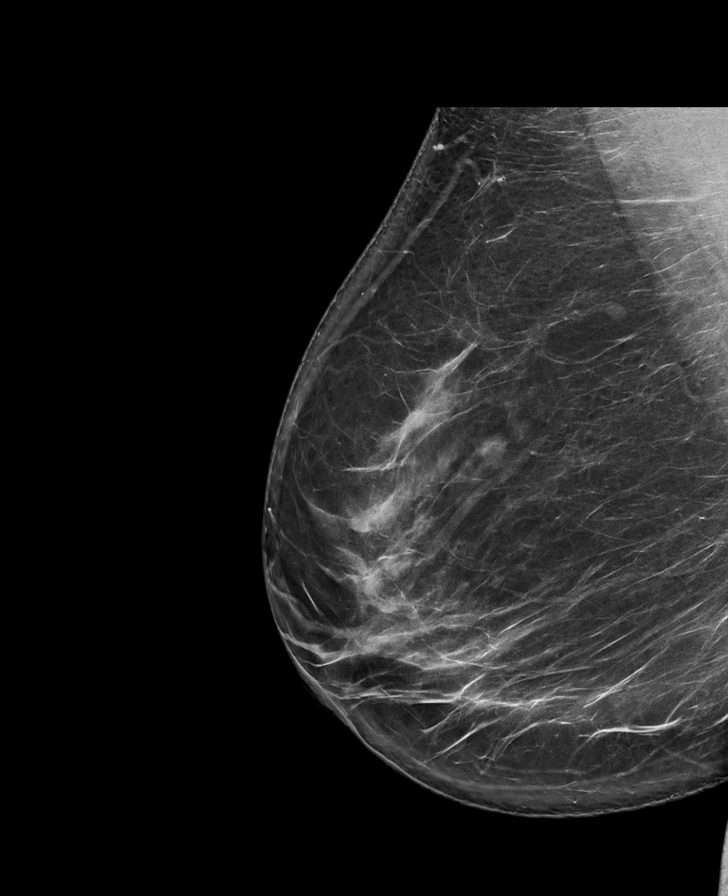

[L MLO tomo · tomo slice 46/91.0]
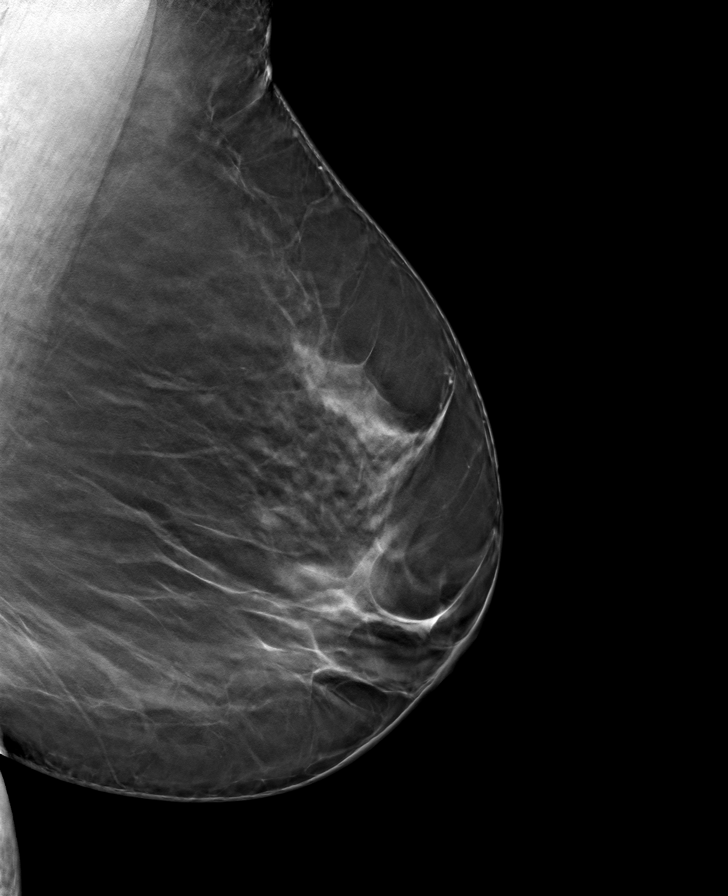

[L CC tomo · tomo slice 41/81.0]
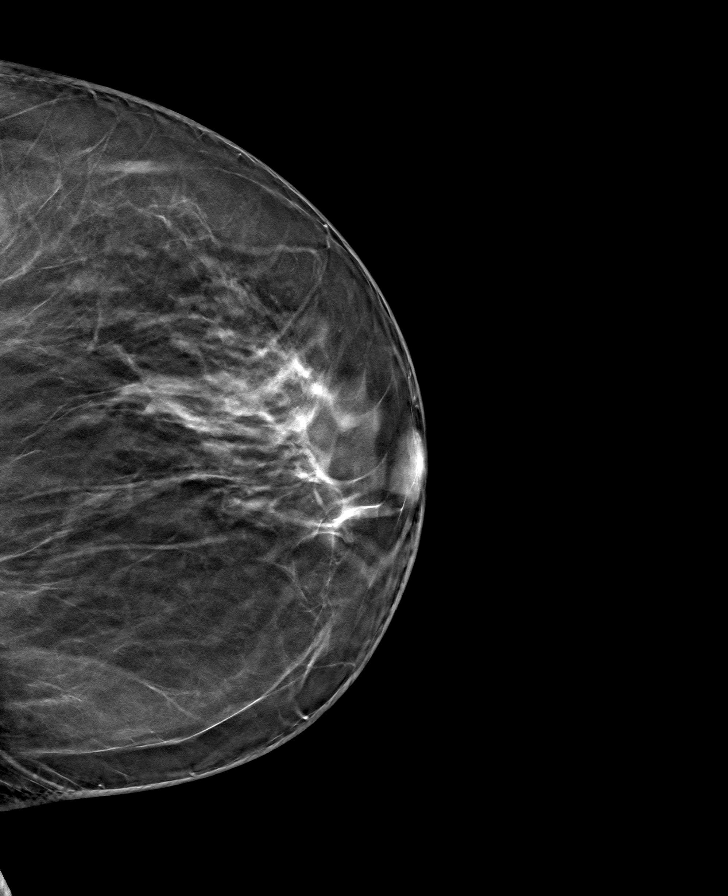

[R MLO tomo · tomo slice 47/93.0]
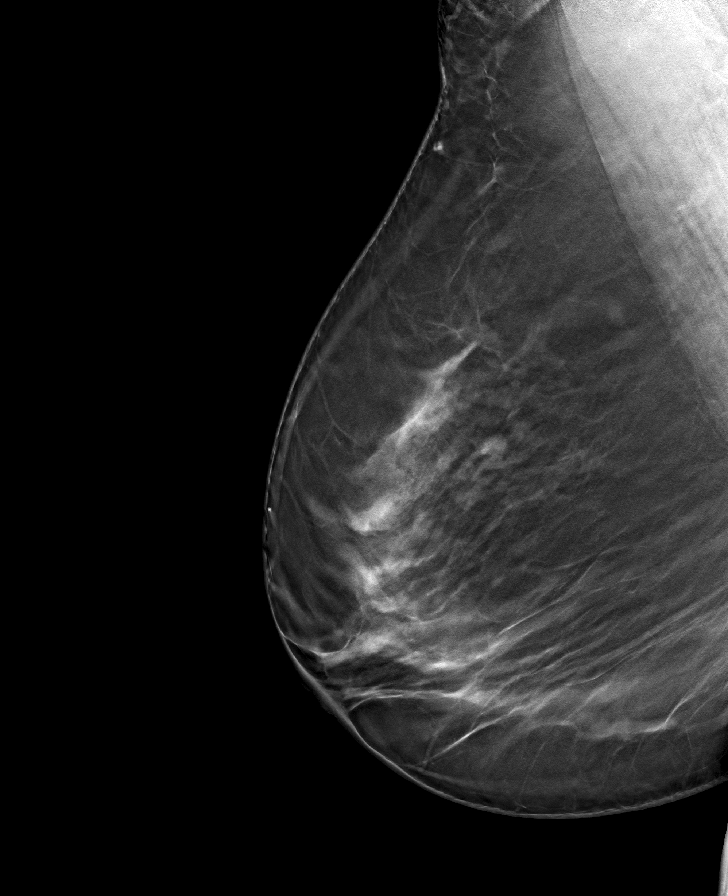

[R CC tomo · tomo slice 39/78.0]
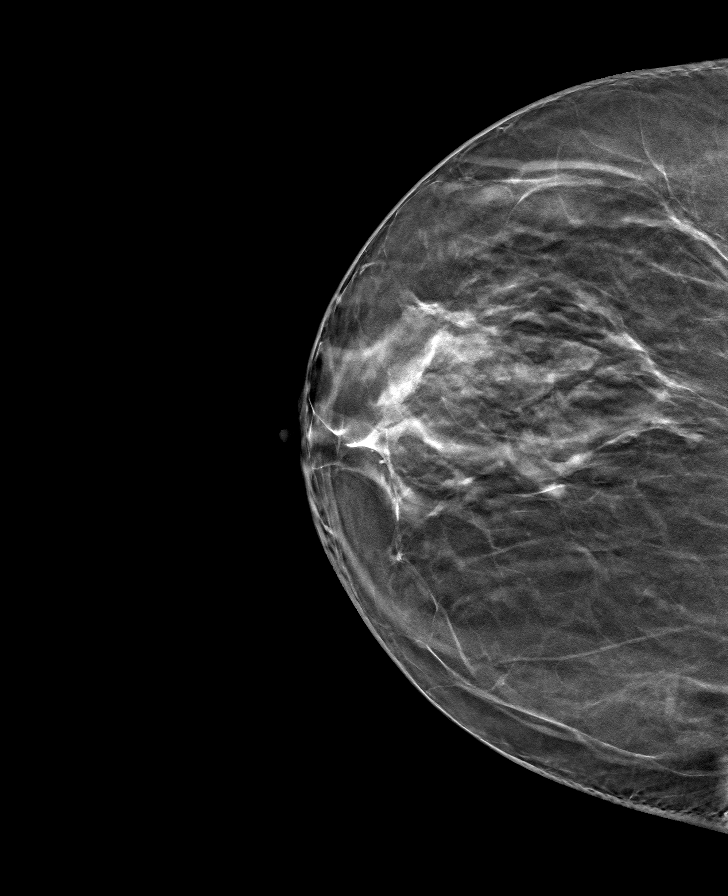

[8 of 24 positions shown; findings below may reference images not displayed]

ACR Breast Density Category b: There are scattered areas of
fibroglandular density.
FINDINGS: There are no findings suspicious for malignancy.
IMPRESSION: No mammographic evidence of malignancy. A result letter of this
screening mammogram will be mailed directly to the patient.

RECOMMENDATION:
Screening mammogram in one year. (Code:51-O-LD2)

BI-RADS CATEGORY  1: Negative.

## 2023-11-19 ENCOUNTER — Other Ambulatory Visit (HOSPITAL_COMMUNITY): Payer: Self-pay

## 2023-11-19 ENCOUNTER — Other Ambulatory Visit: Payer: Self-pay | Admitting: Nurse Practitioner

## 2023-11-19 DIAGNOSIS — E1165 Type 2 diabetes mellitus with hyperglycemia: Secondary | ICD-10-CM

## 2023-11-19 MED ORDER — GLIPIZIDE ER 5 MG PO TB24
5.0000 mg | ORAL_TABLET | Freq: Every day | ORAL | 0 refills | Status: DC
Start: 2023-11-19 — End: 2023-12-24
  Filled 2023-11-19: qty 30, 30d supply, fill #0

## 2023-11-26 ENCOUNTER — Other Ambulatory Visit (HOSPITAL_COMMUNITY): Payer: Self-pay

## 2023-11-26 ENCOUNTER — Other Ambulatory Visit: Payer: Self-pay | Admitting: Nurse Practitioner

## 2023-11-26 DIAGNOSIS — E1165 Type 2 diabetes mellitus with hyperglycemia: Secondary | ICD-10-CM

## 2023-11-26 MED ORDER — METFORMIN HCL ER 500 MG PO TB24
500.0000 mg | ORAL_TABLET | Freq: Every day | ORAL | 0 refills | Status: DC
Start: 2023-11-26 — End: 2023-12-24
  Filled 2023-11-26: qty 30, 30d supply, fill #0

## 2023-12-24 ENCOUNTER — Other Ambulatory Visit: Payer: Self-pay | Admitting: Nurse Practitioner

## 2023-12-24 ENCOUNTER — Other Ambulatory Visit (HOSPITAL_COMMUNITY): Payer: Self-pay

## 2023-12-24 DIAGNOSIS — E1165 Type 2 diabetes mellitus with hyperglycemia: Secondary | ICD-10-CM

## 2023-12-24 MED ORDER — GLIPIZIDE ER 5 MG PO TB24
5.0000 mg | ORAL_TABLET | Freq: Every day | ORAL | 0 refills | Status: DC
Start: 2023-12-24 — End: 2024-01-03
  Filled 2023-12-24 (×2): qty 30, 30d supply, fill #0

## 2023-12-24 MED ORDER — METFORMIN HCL ER 500 MG PO TB24
500.0000 mg | ORAL_TABLET | Freq: Every day | ORAL | 0 refills | Status: DC
Start: 2023-12-24 — End: 2024-01-03
  Filled 2023-12-24: qty 30, 30d supply, fill #0

## 2023-12-25 ENCOUNTER — Other Ambulatory Visit: Payer: Self-pay

## 2024-01-01 ENCOUNTER — Encounter: Payer: Self-pay | Admitting: Nurse Practitioner

## 2024-01-01 ENCOUNTER — Ambulatory Visit: Payer: 59 | Admitting: Nurse Practitioner

## 2024-01-01 VITALS — BP 131/73 | HR 82 | Temp 98.0°F | Resp 18 | Wt 219.0 lb

## 2024-01-01 DIAGNOSIS — E785 Hyperlipidemia, unspecified: Secondary | ICD-10-CM

## 2024-01-01 DIAGNOSIS — M25511 Pain in right shoulder: Secondary | ICD-10-CM

## 2024-01-01 DIAGNOSIS — E1169 Type 2 diabetes mellitus with other specified complication: Secondary | ICD-10-CM | POA: Diagnosis not present

## 2024-01-01 DIAGNOSIS — Z23 Encounter for immunization: Secondary | ICD-10-CM | POA: Diagnosis not present

## 2024-01-01 DIAGNOSIS — G8929 Other chronic pain: Secondary | ICD-10-CM | POA: Diagnosis not present

## 2024-01-01 LAB — COMPREHENSIVE METABOLIC PANEL
ALT: 26 U/L (ref 0–35)
AST: 28 U/L (ref 0–37)
Albumin: 4.3 g/dL (ref 3.5–5.2)
Alkaline Phosphatase: 82 U/L (ref 39–117)
BUN: 15 mg/dL (ref 6–23)
CO2: 30 meq/L (ref 19–32)
Calcium: 9.3 mg/dL (ref 8.4–10.5)
Chloride: 102 meq/L (ref 96–112)
Creatinine, Ser: 0.6 mg/dL (ref 0.40–1.20)
GFR: 100.41 mL/min (ref >60.00)
Glucose, Bld: 131 mg/dL — ABNORMAL HIGH (ref 70–99)
Potassium: 3.6 meq/L (ref 3.5–5.1)
Sodium: 138 meq/L (ref 135–145)
Total Bilirubin: 0.5 mg/dL (ref 0.2–1.2)
Total Protein: 8.2 g/dL (ref 6.0–8.3)

## 2024-01-01 LAB — LIPID PANEL
Cholesterol: 218 mg/dL — ABNORMAL HIGH (ref 0–200)
HDL: 50.4 mg/dL (ref >39.00)
LDL Cholesterol: 156 mg/dL — ABNORMAL HIGH (ref 0–99)
NonHDL: 168.05
Total CHOL/HDL Ratio: 4
Triglycerides: 58 mg/dL (ref 0.0–149.0)
VLDL: 11.6 mg/dL (ref 0.0–40.0)

## 2024-01-01 LAB — HEMOGLOBIN A1C: Hgb A1c MFr Bld: 7.2 % — ABNORMAL HIGH (ref 4.6–6.5)

## 2024-01-01 NOTE — Assessment & Plan Note (Signed)
Chronic, waxing and waning, Worse with repetitive use at current job and cold weather. 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

## 2024-01-01 NOTE — Progress Notes (Signed)
Established Patient Visit  Patient: Amy [name]   DOB: 09-15-1966   58 y.o. Female  MRN: [id number] Visit Date: 01/01/2024  Subjective:    Chief Complaint  Patient presents with   Medical Managment for Chronic Issues    3 month follow up hypertension, DM and hyperlipidemia; eye exam is Prevnar 20 vaccine is due    HPI Right shoulder pain Chronic, waxing and waning, Worse with repetitive use at current job and cold weather. 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  Repeat hgbA1c CMP and lipid panel Maintain metformin and glipizide doses F/up in 6months   Reviewed medical, surgical, and social history today  Medications: Outpatient Medications Prior to Visit  Medication Sig   Apple Cider Vinegar 500 MG TABS    blood glucose meter kit and supplies KIT Dispense based on patient and insurance preference. Use before breakfast. ICD 11.65   Fluticasone Propionate (FLONASE NA) Place into the nose. daily   glipiZIDE (GLUCOTROL XL) 5 MG 24 hr tablet Take 1 tablet (5 mg total) by mouth daily with breakfast. Needs appointment for further refills.   glucose blood (FREESTYLE LITE) test strip USE TO TEST BLOOD SUGAR DAILY BEFORE BREAKFAST   Lancets (FREESTYLE) lancets Check glucose once a day before breakfast   lisinopril (ZESTRIL) 2.5 MG tablet Take 1 tablet (2.5 mg total) by mouth daily.   Loratadine (CLARITIN PO) Take by mouth. daily   metFORMIN (GLUCOPHAGE-XR) 500 MG 24 hr tablet Take 1 tablet (500 mg total) by mouth daily with  breakfast.   omeprazole (PRILOSEC) 20 MG capsule Take 1 capsule (20 mg total) by mouth daily.   triamcinolone cream (KENALOG) 0.1 % Apply topically to affected area 2 (two) times daily   [DISCONTINUED] acetic acid-hydrocortisone (VOSOL-HC) OTIC solution Place 3 drops into both ears 3 (three) times daily.   [DISCONTINUED] diclofenac Sodium (VOLTAREN) 1 % GEL Apply 2 g topically 4 (four) times daily.   No facility-administered medications prior to visit.   Reviewed past medical and social history.   ROS per HPI above      Objective:  BP 131/73 (BP Location: Left Arm, Patient Position: Sitting, Cuff Size: Large)   Pulse 82   Temp 98 F (36.7 C) (Temporal)   Resp 18   Wt 219 lb (99.3 kg)   LMP 06/04/2019   SpO2 100%   BMI 31.42 kg/m      Physical Exam Vitals and nursing note reviewed.  Cardiovascular:     Rate and Rhythm: Normal rate and regular rhythm.     Pulses: Normal pulses.     Heart sounds: Normal heart sounds.  Pulmonary:     Effort: Pulmonary effort is normal.     Breath sounds: Normal breath sounds.  Musculoskeletal:     Right lower leg: No edema.     Left lower leg: No edema.  Neurological:     Mental Status: She is alert and oriented to person, place, and time.     No results found for any visits  on 01/01/24.    Assessment & Plan:    Problem List Items Addressed This Visit     DM (diabetes mellitus) (HCC) - Primary   No glucose check at home Normal foot exam Advised to schedule DIABETES eye exam BP at goal.  Repeat hgbA1c CMP and lipid panel Maintain metformin and glipizide doses F/up in 6months      Relevant Orders   Hemoglobin A1c   Comprehensive metabolic panel   Hyperlipidemia associated with type 2 diabetes mellitus (HCC)   Repeat lipid panel Advised about importance of low carb/low fat diet and daily exercise      Relevant Orders   Lipid panel   Comprehensive metabolic panel   Right shoulder pain   Chronic, waxing and waning,  Worse with repetitive use at current job and cold weather. 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     Alysia Penna, NP

## 2024-01-01 NOTE — Patient Instructions (Addendum)
Go to lab Maintain Heart healthy diet and daily exercise. Maintain current medications. Schedule appointment with ophthalmology

## 2024-01-01 NOTE — Assessment & Plan Note (Signed)
Repeat lipid panel Advised about importance of low carb/low fat diet and daily exercise

## 2024-01-01 NOTE — Assessment & Plan Note (Signed)
No glucose check at home Normal foot exam Advised to schedule DIABETES eye exam BP at goal.  Repeat hgbA1c CMP and lipid panel Maintain metformin and glipizide doses F/up in 6months

## 2024-01-03 ENCOUNTER — Other Ambulatory Visit: Payer: Self-pay | Admitting: Nurse Practitioner

## 2024-01-03 ENCOUNTER — Encounter: Payer: Self-pay | Admitting: Nurse Practitioner

## 2024-01-03 ENCOUNTER — Other Ambulatory Visit (HOSPITAL_COMMUNITY): Payer: Self-pay

## 2024-01-03 DIAGNOSIS — E1169 Type 2 diabetes mellitus with other specified complication: Secondary | ICD-10-CM

## 2024-01-03 DIAGNOSIS — E1165 Type 2 diabetes mellitus with hyperglycemia: Secondary | ICD-10-CM

## 2024-01-03 MED ORDER — GLIPIZIDE ER 5 MG PO TB24
5.0000 mg | ORAL_TABLET | Freq: Every day | ORAL | 1 refills | Status: DC
  Filled 2024-01-03 – 2024-01-22 (×2): qty 90, 90d supply, fill #0
  Filled 2024-04-18: qty 90, 90d supply, fill #1
  Filled 2024-04-18: qty 90, 90d supply, fill #0

## 2024-01-03 MED ORDER — ATORVASTATIN CALCIUM 20 MG PO TABS
20.0000 mg | ORAL_TABLET | Freq: Every evening | ORAL | 1 refills | Status: DC
  Filled 2024-01-03: qty 90, 90d supply, fill #0

## 2024-01-03 MED ORDER — METFORMIN HCL ER 500 MG PO TB24
500.0000 mg | ORAL_TABLET | Freq: Two times a day (BID) | ORAL | 1 refills | Status: DC
  Filled 2024-01-03 – 2024-02-05 (×2): qty 180, 90d supply, fill #0
  Filled 2024-08-22: qty 180, 90d supply, fill #1

## 2024-01-03 NOTE — Assessment & Plan Note (Signed)
Increase in hgbA1c: 6.6 to 7.2%: increase metformin dose to 500mg  BID. Maintain glipizide dose. Med refills sent Abnormal lipid panel: sent atorvastatin 20mg  in PM Stable CMP Schedule lab appointment to repeat fasting labs in 3months

## 2024-01-04 ENCOUNTER — Other Ambulatory Visit (HOSPITAL_COMMUNITY): Payer: Self-pay

## 2024-01-04 ENCOUNTER — Encounter: Payer: Self-pay | Admitting: Nurse Practitioner

## 2024-01-04 DIAGNOSIS — E1169 Type 2 diabetes mellitus with other specified complication: Secondary | ICD-10-CM

## 2024-01-04 NOTE — Assessment & Plan Note (Signed)
Unable to tolerate increase metformin dose. Reports non compliance with med doses for last 3months due to stressors in personal life.  Maintain med doses: metformin 500mg  every day and glipizide 5mg  every day. Repeat labs in 3months Increase glipizide dose or add rybelsus if no improvement in hgbA1c

## 2024-01-07 ENCOUNTER — Ambulatory Visit (INDEPENDENT_AMBULATORY_CARE_PROVIDER_SITE_OTHER): Payer: 59 | Admitting: Physician Assistant

## 2024-01-07 ENCOUNTER — Other Ambulatory Visit: Payer: Self-pay

## 2024-01-07 ENCOUNTER — Encounter: Payer: Self-pay | Admitting: Physician Assistant

## 2024-01-07 ENCOUNTER — Other Ambulatory Visit (INDEPENDENT_AMBULATORY_CARE_PROVIDER_SITE_OTHER): Payer: Self-pay

## 2024-01-07 DIAGNOSIS — M25511 Pain in right shoulder: Secondary | ICD-10-CM

## 2024-01-07 DIAGNOSIS — G8929 Other chronic pain: Secondary | ICD-10-CM

## 2024-01-07 MED ORDER — METHYLPREDNISOLONE ACETATE 40 MG/ML IJ SUSP
40.0000 mg | INTRAMUSCULAR | Status: AC | PRN
  Administered 2024-01-07: 40 mg via INTRA_ARTICULAR

## 2024-01-07 MED ORDER — LIDOCAINE HCL 1 % IJ SOLN
3.0000 mL | INTRAMUSCULAR | Status: AC | PRN
  Administered 2024-01-07: 3 mL
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
## 2024-01-11 DIAGNOSIS — H524 Presbyopia: Secondary | ICD-10-CM | POA: Diagnosis not present

## 2024-01-11 LAB — HM DIABETES EYE EXAM

## 2024-01-18 ENCOUNTER — Encounter: Payer: Self-pay | Admitting: Nurse Practitioner

## 2024-01-22 ENCOUNTER — Other Ambulatory Visit (HOSPITAL_COMMUNITY): Payer: Self-pay

## 2024-01-24 ENCOUNTER — Other Ambulatory Visit: Payer: Self-pay

## 2024-01-24 ENCOUNTER — Encounter (HOSPITAL_COMMUNITY): Payer: Self-pay | Admitting: *Deleted

## 2024-01-24 ENCOUNTER — Emergency Department (HOSPITAL_COMMUNITY): Payer: 59

## 2024-01-24 ENCOUNTER — Emergency Department (HOSPITAL_COMMUNITY)
Admission: EM | Admit: 2024-01-24 | Discharge: 2024-01-24 | Disposition: A | Discharge status: Home / Self Care | Payer: 59 | Attending: Emergency Medicine | Admitting: Emergency Medicine

## 2024-01-24 ENCOUNTER — Telehealth: Payer: Self-pay | Admitting: Physician Assistant

## 2024-01-24 ENCOUNTER — Other Ambulatory Visit (HOSPITAL_COMMUNITY): Payer: Self-pay

## 2024-01-24 DIAGNOSIS — K529 Noninfective gastroenteritis and colitis, unspecified: Secondary | ICD-10-CM | POA: Insufficient documentation

## 2024-01-24 DIAGNOSIS — E119 Type 2 diabetes mellitus without complications: Secondary | ICD-10-CM | POA: Diagnosis not present

## 2024-01-24 DIAGNOSIS — R0602 Shortness of breath: Secondary | ICD-10-CM | POA: Insufficient documentation

## 2024-01-24 DIAGNOSIS — M549 Dorsalgia, unspecified: Secondary | ICD-10-CM | POA: Diagnosis not present

## 2024-01-24 DIAGNOSIS — R160 Hepatomegaly, not elsewhere classified: Secondary | ICD-10-CM | POA: Diagnosis not present

## 2024-01-24 DIAGNOSIS — R0789 Other chest pain: Secondary | ICD-10-CM | POA: Diagnosis not present

## 2024-01-24 DIAGNOSIS — R079 Chest pain, unspecified: Secondary | ICD-10-CM | POA: Diagnosis not present

## 2024-01-24 DIAGNOSIS — R0682 Tachypnea, not elsewhere classified: Secondary | ICD-10-CM | POA: Diagnosis not present

## 2024-01-24 DIAGNOSIS — I10 Essential (primary) hypertension: Secondary | ICD-10-CM | POA: Diagnosis not present

## 2024-01-24 DIAGNOSIS — R188 Other ascites: Secondary | ICD-10-CM | POA: Diagnosis not present

## 2024-01-24 DIAGNOSIS — K5730 Diverticulosis of large intestine without perforation or abscess without bleeding: Secondary | ICD-10-CM | POA: Diagnosis not present

## 2024-01-24 DIAGNOSIS — K429 Umbilical hernia without obstruction or gangrene: Secondary | ICD-10-CM | POA: Diagnosis not present

## 2024-01-24 DIAGNOSIS — Z7984 Long term (current) use of oral hypoglycemic drugs: Secondary | ICD-10-CM | POA: Insufficient documentation

## 2024-01-24 DIAGNOSIS — M79602 Pain in left arm: Secondary | ICD-10-CM | POA: Diagnosis not present

## 2024-01-24 LAB — BASIC METABOLIC PANEL
Anion gap: 11 (ref 5–15)
BUN: 13 mg/dL (ref 6–20)
CO2: 23 mmol/L (ref 22–32)
Calcium: 9.2 mg/dL (ref 8.9–10.3)
Chloride: 106 mmol/L (ref 98–111)
Creatinine, Ser: 0.78 mg/dL (ref 0.44–1.00)
GFR, Estimated: >60 mL/min (ref >60)
Glucose, Bld: 152 mg/dL — ABNORMAL HIGH (ref 70–99)
Potassium: 3.3 mmol/L — ABNORMAL LOW (ref 3.5–5.1)
Sodium: 140 mmol/L (ref 135–145)

## 2024-01-24 LAB — D-DIMER, QUANTITATIVE: D-Dimer, Quant: 0.39 ug{FEU}/mL (ref 0.00–0.50)

## 2024-01-24 LAB — HEPATIC FUNCTION PANEL
ALT: 24 U/L (ref 0–44)
AST: 27 U/L (ref 15–41)
Albumin: 3.8 g/dL (ref 3.5–5.0)
Alkaline Phosphatase: 69 U/L (ref 38–126)
Bilirubin, Direct: 0.1 mg/dL (ref 0.0–0.2)
Indirect Bilirubin: 0.6 mg/dL (ref 0.3–0.9)
Total Bilirubin: 0.7 mg/dL (ref 0.0–1.2)
Total Protein: 8.3 g/dL — ABNORMAL HIGH (ref 6.5–8.1)

## 2024-01-24 LAB — CBC
HCT: 43.6 % (ref 36.0–46.0)
Hemoglobin: 13.9 g/dL (ref 12.0–15.0)
MCH: 27.8 pg (ref 26.0–34.0)
MCHC: 31.9 g/dL (ref 30.0–36.0)
MCV: 87.2 fL (ref 80.0–100.0)
Platelets: 243 10*3/uL (ref 150–400)
RBC: 5 MIL/uL (ref 3.87–5.11)
RDW: 13.3 % (ref 11.5–15.5)
WBC: 6.8 10*3/uL (ref 4.0–10.5)
nRBC: 0 % (ref 0.0–0.2)

## 2024-01-24 LAB — TROPONIN I (HIGH SENSITIVITY)
Troponin I (High Sensitivity): 4 ng/L (ref <18)
Troponin I (High Sensitivity): 4 ng/L (ref <18)

## 2024-01-24 LAB — CK: Total CK: 205 U/L (ref 38–234)

## 2024-01-24 LAB — LIPASE, BLOOD: Lipase: 31 U/L (ref 11–51)

## 2024-01-24 MED ORDER — ONDANSETRON HCL 4 MG/2ML IJ SOLN
4.0000 mg | Freq: Once | INTRAMUSCULAR | Status: AC
  Administered 2024-01-24: 4 mg via INTRAVENOUS
  Filled 2024-01-24: qty 2

## 2024-01-24 MED ORDER — POTASSIUM CHLORIDE CRYS ER 20 MEQ PO TBCR
40.0000 meq | EXTENDED_RELEASE_TABLET | Freq: Once | ORAL | Status: AC
Start: 2024-01-24 — End: 2024-01-24
  Administered 2024-01-24: 40 meq via ORAL
  Filled 2024-01-24: qty 2

## 2024-01-24 MED ORDER — KETOROLAC TROMETHAMINE 15 MG/ML IJ SOLN
15.0000 mg | Freq: Once | INTRAMUSCULAR | Status: AC
  Administered 2024-01-24: 15 mg via INTRAVENOUS
  Filled 2024-01-24: qty 1

## 2024-01-24 MED ORDER — METOPROLOL TARTRATE 25 MG PO TABS
25.0000 mg | ORAL_TABLET | Freq: Once | ORAL | Status: AC
  Administered 2024-01-24: 25 mg via ORAL
  Filled 2024-01-24: qty 1

## 2024-01-24 MED ORDER — METHOCARBAMOL 500 MG PO TABS
500.0000 mg | ORAL_TABLET | Freq: Two times a day (BID) | ORAL | 0 refills | Status: DC
  Filled 2024-01-24: qty 20, 10d supply, fill #0

## 2024-01-24 MED ORDER — IOHEXOL 350 MG/ML SOLN
75.0000 mL | Freq: Once | INTRAVENOUS | Status: AC | PRN
  Administered 2024-01-24: 75 mL via INTRAVENOUS

## 2024-01-24 MED ORDER — METOPROLOL TARTRATE 25 MG PO TABS
50.0000 mg | ORAL_TABLET | Freq: Two times a day (BID) | ORAL | 0 refills | Status: DC
  Filled 2024-01-24: qty 60, 15d supply, fill #0

## 2024-01-24 NOTE — Telephone Encounter (Signed)
 Dr. Odis Hollingshead reached out requesting to place this patient on his schedule for Tuesday 2/25, scheduled for 3:20pm. I called patient to notify her of this information. She was appreciative of call.

## 2024-01-24 NOTE — ED Triage Notes (Signed)
 The pt has multiple complaints chest pain lt arm pain back pain high bp  vertigo rapid heart rate no temp all symptoms for a week

## 2024-01-24 NOTE — ED Provider Notes (Signed)
  Physical Exam  BP 126/77   Pulse 89   Temp 98.4 F (36.9 C) (Oral)   Resp 18   Ht 5\' 10"  (1.778 m)   Wt 99.3 kg   LMP 06/04/2019   SpO2 96%   BMI 31.41 kg/m   Physical Exam  Procedures  Procedures  ED Course / MDM    Medical Decision Making Amount and/or Complexity of Data Reviewed Labs: ordered. Radiology: ordered. ECG/medicine tests: ordered.  Risk Prescription drug management.   Wardell Honour, assumed care for this patient.  In brief 58 year old female who is here today for pain over the right neck.  Patient was a signout pending formal echocardiogram.  Patient has unfortunate been with [place] for the whole morning, no longer wishes to wait would rather proceed with outpatient workup.  Will prescribe her some medications for presumed musculoskeletal injury, will provide cardiology follow-up.       Anders Simmonds T, DO 01/24/24 1319

## 2024-01-24 NOTE — Discharge Instructions (Addendum)
 Your testing is reassuring.  No evidence of heart attack.  You are low risk for heart disease but nonzero risk.  You should follow-up with the cardiologist for a stress test.  Return to the ED with exertional chest pain, pain associate with shortness of breath, nausea, vomiting, sweating or other concerns.  You can take Robaxin up to 2 times per day for your pain.  This is a muscle relaxer.  Do not drive if you are taking this medication.

## 2024-01-24 NOTE — ED Notes (Signed)
 Patient transported to CT scan .

## 2024-01-24 NOTE — ED Provider Notes (Signed)
 [place] EMERGENCY DEPARTMENT AT [facility] Provider Note   CSN: [id number] Arrival date & time: 01/24/24  4098     History  Chief Complaint  Patient presents with   Shortness of Breath    Amy [name] is a 58 y.o. female.  Patient with a history of diabetes, hyperlipidemia presents with multiple complaints.  She works in housekeeping at [employer] and is having issues with right shoulder pain for many months.  Pain is worse with repetitive motion and simple tasks.  She recently saw orthopedics and had an injection of cortisone and was told she had calcifications in her shoulder joint.  She comes in tonight because of multiple issues.  States she developed some nausea, vomiting and diarrhea about 1 AM.  Had 2 episodes of diarrhea at home that were nonbloody as well as 1 episode of vomiting which was nonbloody.  Having some crampy upper abdominal pain.  No fever.  No travel or sick contacts.  Also feels like she is having right flank pain for the past 2 weeks that comes and goes worse at certain times.  Denies any injury.  No focal weakness, numbness or tingling.  No bowel or bladder incontinence.  No history of IV drug abuse or cancer.  Finally she is feels like her right shoulder pain occasionally radiates into her chest and neck.  This pain comes and goes multiple times a day lasting for 30 to 40 minutes at a time.  She feels like it is coming from her shoulder but it radiates into her right chest and her right side of her neck.  Some associated shortness of breath.  Pain is not exertional or pleuritic.  No cough or fever.  She denies any cardiac history and has never had a stress test.  She felt excessively fatigued and more short of breath this morning so she wanted to get evaluated.  He is not having any chest pain or shoulder pain currently.  She feels the shoulder pain is present more often than not but the radiation to the chest and neck is new and has been  taking ibuprofen at home.  The history is provided by the patient.  Shortness of Breath Associated symptoms: abdominal pain, chest pain and vomiting   Associated symptoms: no fever, no headaches and no rash        Home Medications Prior to Admission medications   Medication Sig Start Date End Date Taking? Authorizing Provider  Apple Cider Vinegar 500 MG TABS  12/04/22   [provider]  atorvastatin (LIPITOR) 20 MG tablet Take 1 tablet (20 mg total) by mouth every evening. 01/03/24   Nche, Bonna Gains, NP  blood glucose meter kit and supplies KIT Dispense based on patient and insurance preference. Use before breakfast. ICD 11.65 11/10/19   Nche, Bonna Gains, NP  Fluticasone Propionate (FLONASE NA) Place into the nose. daily    [provider]  glipiZIDE (GLUCOTROL XL) 5 MG 24 hr tablet Take 1 tablet (5 mg total) by mouth daily with breakfast. 01/03/24   Nche, Bonna Gains, NP  glucose blood (FREESTYLE LITE) test strip USE TO TEST BLOOD SUGAR DAILY BEFORE BREAKFAST 10/25/22   Nche, Bonna Gains, NP  Lancets (FREESTYLE) lancets Check glucose once a day before breakfast 10/25/22   Nche, Bonna Gains, NP  lisinopril (ZESTRIL) 2.5 MG tablet Take 1 tablet (2.5 mg total) by mouth daily. 05/08/23   Nche, Bonna Gains, NP  Loratadine (CLARITIN PO) Take by  mouth. daily    [provider]  metFORMIN (GLUCOPHAGE-XR) 500 MG 24 hr tablet Take 1 tablet (500 mg total) by mouth 2 (two) times daily with a meal. 01/03/24   Nche, Bonna Gains, NP  omeprazole (PRILOSEC) 20 MG capsule Take 1 capsule (20 mg total) by mouth daily. 05/08/23   Nche, Bonna Gains, NP  triamcinolone cream (KENALOG) 0.1 % Apply topically to affected area 2 (two) times daily 05/08/23   Nche, Bonna Gains, NP      Allergies    Patient has no known allergies.    Review of Systems   Review of Systems  Constitutional:  Negative for activity change, appetite change and fever.  HENT:  Negative for congestion  and rhinorrhea.   Respiratory:  Positive for chest tightness and shortness of breath.   Cardiovascular:  Positive for chest pain.  Gastrointestinal:  Positive for abdominal pain, diarrhea, nausea and vomiting.  Genitourinary:  Negative for dysuria and hematuria.  Musculoskeletal:  Positive for back pain.  Skin:  Negative for rash.  Neurological:  Negative for dizziness, weakness, light-headedness and headaches.   all other systems are negative except as noted in the HPI and PMH.    Physical Exam Updated Vital Signs BP (!) 156/101 (BP Location: Right Arm)   Pulse (!) 102   Temp 98.4 F (36.9 C) (Oral)   Resp 14   Ht 5\' 10"  (1.778 m)   Wt 99.3 kg   LMP 06/04/2019   SpO2 99%   BMI 31.41 kg/m  Physical Exam Vitals and nursing note reviewed.  Constitutional:      General: She is not in acute distress.    Appearance: She is well-developed.  HENT:     Head: Normocephalic and atraumatic.     Mouth/Throat:     Pharynx: No oropharyngeal exudate.  Eyes:     Conjunctiva/sclera: Conjunctivae normal.     Pupils: Pupils are equal, round, and reactive to light.  Neck:     Comments: No meningismus. Cardiovascular:     Rate and Rhythm: Normal rate and regular rhythm.     Heart sounds: Normal heart sounds. No murmur heard. Pulmonary:     Effort: Pulmonary effort is normal. No respiratory distress.     Breath sounds: Normal breath sounds.  Chest:     Chest wall: No tenderness.  Abdominal:     Palpations: Abdomen is soft.     Tenderness: There is no abdominal tenderness. There is no guarding or rebound.  Musculoskeletal:        General: No tenderness. Normal range of motion.     Cervical back: Normal range of motion and neck supple.     Comments: No pain with range of motion of the right shoulder currently.  Intact radial pulse and cardinal hand movements  Skin:    General: Skin is warm.  Neurological:     Mental Status: She is alert and oriented to person, place, and time.      Cranial Nerves: No cranial nerve deficit.     Motor: No abnormal muscle tone.     Coordination: Coordination normal.     Comments:  5/5 strength throughout. CN 2-12 intact.Equal grip strength.   Psychiatric:        Behavior: Behavior normal.     ED Results / Procedures / Treatments   Labs (all labs ordered are listed, but only abnormal results are displayed) Labs Reviewed  BASIC METABOLIC PANEL - Abnormal; Notable for the following components:  Result Value   Potassium 3.3 (*)    Glucose, Bld 152 (*)    All other components within normal limits  HEPATIC FUNCTION PANEL - Abnormal; Notable for the following components:   Total Protein 8.3 (*)    All other components within normal limits  CBC  D-DIMER, QUANTITATIVE  LIPASE, BLOOD  CK  TROPONIN I (HIGH SENSITIVITY)  TROPONIN I (HIGH SENSITIVITY)    EKG EKG Interpretation Date/Time:  Thursday January 24 2024 02:39:54 EST Ventricular Rate:  96 PR Interval:  146 QRS Duration:  92 QT Interval:  390 QTC Calculation: 492 R Axis:   65  Text Interpretation: Normal sinus rhythm Nonspecific ST abnormality Prolonged QT Abnormal ECG When compared with ECG of 14-Sep-2017 03:39, PREVIOUS ECG IS PRESENT No significant change was found Confirmed by Glynn Octave [phone]) on 01/24/2024 3:49:13 AM  Radiology DG Chest 2 View Result Date: 01/24/2024 CLINICAL DATA:  Chest pain, tachypnea EXAM: CHEST - 2 VIEW COMPARISON:  09/14/2017 FINDINGS: Lungs are clear.  No pleural effusion or pneumothorax. The heart is normal in size. Mild degenerative changes of the visualized thoracolumbar spine. IMPRESSION: Normal chest radiographs. Electronically Signed   By: Charline Bills M.D.   On: 01/24/2024 02:49    Procedures Procedures    Medications Ordered in ED Medications - No data to display  ED Course/ Medical Decision Making/ A&P                                 Medical Decision Making Amount and/or Complexity of Data Reviewed Labs:  ordered. Decision-making details documented in ED Course. Radiology: ordered and independent interpretation performed. Decision-making details documented in ED Course. ECG/medicine tests: ordered and independent interpretation performed. Decision-making details documented in ED Course.  Risk Prescription drug management.   Ongoing shoulder pain for several months recently radiating to the right chest and neck with shortness of breath.  Vitals are stable.  No distress.  EKG with nonspecific T wave changes similar to previous.  Abdomen is soft without peritoneal signs.  Troponin negative.  LFTs and lipase are normal.  D-dimer is negative with low suspicion for PE. Heart score is 3-4  Patient's pain may be referred from her shoulder pain though she does have cardiac risk factors including diabetes and hypertension and high cholesterol.  Troponin is negative, EKG is unchanged.  CT abdomen pelvis negative for acute findings but does show evidence of enteritis.  No bowel obstruction.  Patient feels improved and is tolerating p.o. Discussed she should follow-up with cardiology for a stress test.  Return to the ED with exertional chest pain, pain associate shortness of breath, nausea, vomiting, sweating or other concerns.  Patient hesitant to go home as she still having right-sided chest and shoulder pain that radiates to her right neck.  Consider musculoskeletal shoulder pain or possible cervical radiculopathy.  She is still having intermittent chest pain, shortness of breath with nausea.  She would like to stay in the hospital today for stress test is possible.  I have advised her this may not be possible depending on scheduling.  Discussed with Dr. Antionette Char.  He states he does not have ability to order stress test.  Recommend discussion with cardiology.  Would not need admission otherwise.  D/w Dr. Odis Hollingshead.  He agrees her description of the pain is atypical for ACS but she does have risk factors.  Given patient's age and risk factors he would prefer CT  coronary study which takes multiple hours and requires specific heart rate parameters.  He recommends echocardiogram and then if this is abnormal may pursue further workup.  Advises metoprolol 25 mg twice daily.  He will see patient in follow-up.  Cardiogram pending at shift change. Dr. Odis Hollingshead wishes to be contacted after echocardiogram results. Care transferred to Dr. Andria Meuse at shift change        Final Clinical Impression(s) / ED Diagnoses Final diagnoses:  Atypical chest pain  Gastroenteritis    Rx / DC Orders ED Discharge Orders     None         Floris Neuhaus, Jeannett Senior, MD 01/24/24 [phone]

## 2024-01-24 NOTE — Progress Notes (Addendum)
 ON-CALL CARDIOLOGY 01/24/24  Patient's name: Amy [name].   MRN: [id number].    DOB: Nov 07, 1966 Primary care provider: Anne Ng, NP. Primary cardiologist: none   Requesting physician: Dr. Manus Gunning  Patient location: [facility] emergency room department. Physician location: [facility]  Interaction regarding this patient's care today: As per my discussion with the ER physician she presents to the hospital with multiple chief complaints one of them being chest pain. The chest pain is located in the right shoulder and radiates to the anterior chest wall.  The discomfort is nonexertional, nonpleuritic, nonpositional.  Patient is concerned that she may have CAD.  High sensitive troponins are negative x 2.  An EKG notes sinus rhythm with subtle ST changes similar to prior tracings.  Currently chest pain-free  LABORATORY DATA:    Latest Ref Rng & Units 01/24/2024    2:36 AM 01/28/2021   10:07 AM 11/07/2019    9:41 AM  CBC  WBC 4.0 - 10.5 K/uL 6.8  4.1  4.2   Hemoglobin 12.0 - 15.0 g/dL 47.8  29.5  62.1   Hematocrit 36.0 - 46.0 % 43.6  41.8  42.9   Platelets 150 - 400 K/uL 243  189.0  190.0        Latest Ref Rng & Units 01/24/2024    4:25 AM 01/24/2024    2:36 AM 01/01/2024    8:49 AM  CMP  Glucose 70 - 99 mg/dL  308  657   BUN 6 - 20 mg/dL  13  15   Creatinine 8.46 - 1.00 mg/dL  9.62  9.52   Sodium 841 - 145 mmol/L  140  138   Potassium 3.5 - 5.1 mmol/L  3.3  3.6   Chloride 98 - 111 mmol/L  106  102   CO2 22 - 32 mmol/L  23  30   Calcium 8.9 - 10.3 mg/dL  9.2  9.3   Total Protein 6.5 - 8.1 g/dL 8.3   8.2   Total Bilirubin 0.0 - 1.2 mg/dL 0.7   0.5   Alkaline Phos 38 - 126 U/L 69   82   AST 15 - 41 U/L 27   28   ALT 0 - 44 U/L 24   26     Lipid Panel     Component Value Date/Time   CHOL 218 (H) 01/01/2024 0849   TRIG 58.0 01/01/2024 0849   HDL 50.40 01/01/2024 0849   CHOLHDL 4 01/01/2024 0849   VLDL 11.6 01/01/2024 0849   LDLCALC 156 (H) 01/01/2024  0849   LDLCALC 153 (H) 10/25/2022 1429   BMP Recent Labs    05/08/23 1133 01/01/24 0849 01/24/24 0236  NA 138 138 140  K 3.8 3.6 3.3*  CL 103 102 106  CO2 27 30 23   GLUCOSE 97 131* 152*  BUN 15 15 13   CREATININE 0.55 0.60 0.78  CALCIUM 9.3 9.3 9.2  GFRNONAA  --   --  >60    HEMOGLOBIN A1C Lab Results  Component Value Date   HGBA1C 7.2 (H) 01/01/2024   MPG 177 10/25/2022    Cardiac Panel (last 3 results) Recent Labs    01/24/24 0236 01/24/24 0425  CKTOTAL  --  205  TROPONINIHS 4 4    CHOLESTEROL Lab Results  Component Value Date   CHOL 218 (H) 01/01/2024   HDL 50.40 01/01/2024   LDLCALC 156 (H) 01/01/2024   TRIG 58.0 01/01/2024   CHOLHDL 4 01/01/2024  Hepatic Function Panel Recent Labs    05/08/23 1133 01/01/24 0849 01/24/24 0425  PROT 8.1 8.2 8.3*  ALBUMIN 4.2 4.3 3.8  AST 21 28 27   ALT 21 26 24   ALKPHOS 79 82 69  BILITOT 0.5 0.5 0.7  BILIDIR  --   --  0.1  IBILI  --   --  0.6    Impression: Precordial pain Diabetes mellitus. Hyperlipidemia  Recommendations: Agreed with the ER physician that is reasonable to proceed forward outpatient workup with echo and coronary CTA.  However, patient is quite concerned and would like additional workup.  Shared decision was to proceed with echocardiogram to evaluate her LVEF.  If within normal limits could consider outpatient workup.  If LVEF is reduced most likely will need a sooner workup in the hospital.  Patient was agreeable with this plan of care.  Also started metoprolol 25 mg p.o. twice daily.  Continue Lipitor 20 mg p.o. nightly  Will await the results of the echocardiogram prior to final recommendations.  ED provider will call us back.  Telephone encounter total time: 10 minutes   Tessa Lerner, DO, [facility]  [address] [place], [place] [address] 01/24/2024 10:05 AM  Addendum: The patient left without having an echocardiogram done as originally  planned. I had my APP reach out to her to arrange outpatient follow-up on 01/29/2024.  Cass Vandermeulen Alburnett, DO, [facility]

## 2024-01-25 ENCOUNTER — Other Ambulatory Visit (HOSPITAL_COMMUNITY): Payer: Self-pay

## 2024-01-25 ENCOUNTER — Telehealth: Payer: Self-pay

## 2024-01-25 NOTE — Transitions of Care (Post Inpatient/ED Visit) (Signed)
 01/25/2024  Name: TIFANY HIRSCH MRN: [id number] DOB: 09/08/1966  Today's TOC FU Call Status: Today's TOC FU Call Status:: Successful TOC FU Call Completed TOC FU Call Complete Date: 01/24/24 Patient's Name and Date of Birth confirmed.  Transition Care Management Follow-up Telephone Call Date of Discharge: 01/24/24 Discharge Facility: [facility]) Type of Discharge: Emergency Department Reason for ED Visit: Cardiac Conditions, Other: (SOB, Diarrhea and Vomiting) How have you been since you were released from the hospital?: Better Any questions or concerns?: No  Items Reviewed: Did you receive and understand the discharge instructions provided?: Yes Medications obtained,verified, and reconciled?: Yes (Medications Reviewed) Any new allergies since your discharge?: No Dietary orders reviewed?: NA Do you have support at home?: Yes People in Home: other relative(s), significant other, child(ren), adult  Medications Reviewed Today: Medications Reviewed Today     Reviewed by Paulita Fujita, Elpidio Anis, CMA (Certified Medical Assistant) on 01/25/24 at 1440  Med List Status: <None>   Medication Order Taking? Sig Documenting Provider Last Dose Status Informant  Apple Cider Vinegar 500 MG TABS [id number] Yes Take 500 mg by mouth every evening. [provider] Taking Active Self, Pharmacy Records  atorvastatin (LIPITOR) 20 MG tablet [id number] No Take 1 tablet (20 mg total) by mouth every evening.  Patient not taking: Reported on 01/24/2024   Nche, Bonna Gains, NP Not Taking Active Self, Pharmacy Records  fluticasone [facility]) 50 MCG/ACT nasal spray [id number] Yes Place 1 spray into both nostrils daily. [provider] Taking Active Self, Pharmacy Records  glipiZIDE (GLUCOTROL XL) 5 MG 24 hr tablet [id number] Yes Take 1 tablet (5 mg total) by mouth daily with breakfast. Nche, Bonna Gains, NP Taking Active Self, Pharmacy Records  glucose blood (FREESTYLE LITE) test strip  [id number] No USE TO TEST BLOOD SUGAR DAILY BEFORE BREAKFAST  Patient not taking: Reported on 01/25/2024   Anne Ng, NP Not Taking Active Self, Pharmacy Records  ibuprofen (ADVIL) 200 MG tablet [id number] Yes Take 800 mg by mouth every 6 (six) hours as needed for mild pain (pain score 1-3) or moderate pain (pain score 4-6). [provider] Taking Active Self, Pharmacy Records  Lancets (FREESTYLE) lancets [id number]  Check glucose once a day before breakfast Nche, Bonna Gains, NP  Active Self, Pharmacy Records  lisinopril (ZESTRIL) 2.5 MG tablet [id number] Yes Take 1 tablet (2.5 mg total) by mouth daily.  Patient taking differently: Take 2.5 mg by mouth every evening.   Nche, Bonna Gains, NP Taking Active Self, Pharmacy Records  loratadine (CLARITIN) 10 MG tablet [id number] Yes Take 10 mg by mouth at bedtime. [provider] Taking Active Self, Pharmacy Records  metFORMIN (GLUCOPHAGE-XR) 500 MG 24 hr tablet [id number] Yes Take 1 tablet (500 mg total) by mouth 2 (two) times daily with a meal.  Patient taking differently: Take 500 mg by mouth daily.   Nche, Bonna Gains, NP Taking Active Self, Pharmacy Records  methocarbamol (ROBAXIN) 500 MG tablet [id number] Yes Take 1 tablet (500 mg total) by mouth 2 (two) times daily. Arletha Pili, DO Taking Active   metoprolol tartrate (LOPRESSOR) 25 MG tablet [id number] Yes Take 2 tablets (50 mg total) by mouth 2 (two) times daily. Glynn Octave, MD Taking Active   omeprazole (PRILOSEC) 20 MG capsule [id number] Yes Take 1 capsule (20 mg total) by mouth daily. Nche, Bonna Gains, NP Taking Active Self, Pharmacy Records  oxymetazoline (AFRIN) 0.05 % nasal spray [id number] Yes Place 3 sprays into both nostrils 2 (two) times daily. [provider] Taking Active Self, Pharmacy Records           Med Note (CRUTHIS, CHLOE C   Thu Jan 24, 2024  7:59 AM) Pt used this medication on 01/24/24 while in the ED.   triamcinolone cream  (KENALOG) 0.1 % [id number] Yes Apply topically to affected area 2 (two) times daily  Patient taking differently: Apply 1 Application topically as needed (itching).   Nche, Bonna Gains, NP Taking Active Self, Pharmacy Records            Home Care and Equipment/Supplies: Were Home Health Services Ordered?: No Any new equipment or medical supplies ordered?: No  Functional Questionnaire: Do you need assistance with bathing/showering or dressing?: No Do you need assistance with meal preparation?: No Do you need assistance with eating?: No Do you have difficulty maintaining continence: No Do you need assistance with getting out of bed/getting out of a chair/moving?: No Do you have difficulty managing or taking your medications?: No  Follow up appointments reviewed: PCP Follow-up appointment confirmed?:  (Patient will call back to schedule.) Specialist Hospital Follow-up appointment confirmed?: Yes Date of Specialist follow-up appointment?: 01/29/24 Do you need transportation to your follow-up appointment?: No Do you understand care options if your condition(s) worsen?: Yes-patient verbalized understanding    SIGNATURE BMS

## 2024-01-29 ENCOUNTER — Encounter: Payer: Self-pay | Admitting: Cardiology

## 2024-01-29 ENCOUNTER — Ambulatory Visit (INDEPENDENT_AMBULATORY_CARE_PROVIDER_SITE_OTHER): Payer: 59

## 2024-01-29 ENCOUNTER — Ambulatory Visit: Payer: 59 | Attending: Cardiology | Admitting: Cardiology

## 2024-01-29 ENCOUNTER — Other Ambulatory Visit (HOSPITAL_COMMUNITY): Payer: Self-pay

## 2024-01-29 VITALS — BP 140/80 | HR 84 | Resp 16 | Ht 70.0 in | Wt 217.0 lb

## 2024-01-29 DIAGNOSIS — R002 Palpitations: Secondary | ICD-10-CM

## 2024-01-29 DIAGNOSIS — Z6831 Body mass index (BMI) 31.0-31.9, adult: Secondary | ICD-10-CM

## 2024-01-29 DIAGNOSIS — I10 Essential (primary) hypertension: Secondary | ICD-10-CM

## 2024-01-29 DIAGNOSIS — E6609 Other obesity due to excess calories: Secondary | ICD-10-CM | POA: Diagnosis not present

## 2024-01-29 DIAGNOSIS — R072 Precordial pain: Secondary | ICD-10-CM | POA: Diagnosis not present

## 2024-01-29 DIAGNOSIS — E66811 Obesity, class 1: Secondary | ICD-10-CM

## 2024-01-29 DIAGNOSIS — E1165 Type 2 diabetes mellitus with hyperglycemia: Secondary | ICD-10-CM | POA: Diagnosis not present

## 2024-01-29 DIAGNOSIS — E1169 Type 2 diabetes mellitus with other specified complication: Secondary | ICD-10-CM | POA: Diagnosis not present

## 2024-01-29 DIAGNOSIS — E785 Hyperlipidemia, unspecified: Secondary | ICD-10-CM

## 2024-01-29 MED ORDER — METOPROLOL TARTRATE 25 MG PO TABS
25.0000 mg | ORAL_TABLET | Freq: Two times a day (BID) | ORAL | 0 refills | Status: DC
  Filled 2024-01-29 – 2024-03-03 (×2): qty 60, 30d supply, fill #0

## 2024-01-29 NOTE — Progress Notes (Unsigned)
 Enrolled for [employer] to mail a ZIO XT long term holter monitor to the patients address on file.

## 2024-01-29 NOTE — Progress Notes (Signed)
 Cardiology Office Note:  .   Date:  01/29/2024  ID:  Amy [name], DOB 1966-09-16, MRN [id number] PCP:  Anne Ng, NP  Former Cardiology Providers: N/A [facility] Providers Cardiologist:  Tessa Lerner, DO , [facility] (established care 01/29/24) Electrophysiologist:  None  Click to update primary MD,subspecialty MD or APP then REFRESH:1}    Chief Complaint  Patient presents with   Chest Pain   Follow-up    History of Present Illness: .   Amy [name] is a 58 y.o. African-American female whose past medical history and cardiovascular risk factors includes: Non-insulin-dependent diabetes mellitus type 2, hypertension.  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  She was recommended to have an echocardiogram prior to discharge but due to prolonged wait times she decided to have outpatient workup.  This office visit was arranged for further evaluation.  She is a new patient to the practice.  Patient stated that in January 2025 towards a second half and she was started on statin therapy to help improve her lipids.  Shortly thereafter on 01/07/2024 she had a cortisone shot in the right shoulder.  Since then she has been experiencing right arm pain which has been radiating to the right shoulder and right anterior chest wall.  The discomfort at times is also noticeable on the right jawline.  Symptoms last for a few minutes and are self-limited.  Symptoms are not brought on by up related activities and did not resolve with rest.  No worsening factors.  Last episode was 01/24/2024.Marland Kitchen  She walks her puppy 30 minutes a day in the backyard otherwise no structured exercise program or daily routine.  She is been a diabetic for the last 2 years at least.  According to the patient her blood pressures range at home between 120-140 mmHg.  She also endorses palpitations/fluttering in her  chest. Occurs daily.  Duration 15 minutes. Self-limited. Worse with exertion and arm pain. Self-limited. No near-syncope or syncopal events.  Factors to consider: History of thyroid disease: None History of anemia: None Coffee consumption: 1 cup / day  Caffeinated beverages/sodas: 1 every other day.  Energy drinks: None Alcohol use: none Stimulants: none Illicit drugs: None Weight loss supplements: None New over-the-counter medications: None Herbal supplements: None   Review of Systems: .   Review of Systems  Constitutional: Positive for malaise/fatigue.  Cardiovascular:  Positive for chest pain, dyspnea on exertion and palpitations. Negative for claudication, irregular heartbeat, leg swelling, near-syncope, orthopnea, paroxysmal nocturnal dyspnea and syncope.  Respiratory:  Positive for shortness of breath.   Hematologic/Lymphatic: Negative for bleeding problem.    Studies Reviewed:   EKG: EKG Interpretation Date/Time:  Tuesday January 29 2024 15:37:50 EST Ventricular Rate:  68 PR Interval:  146 QRS Duration:  98 QT Interval:  418 QTC Calculation: 444 R Axis:   45  Text Interpretation: Normal sinus rhythm Normal ECG When compared with ECG of 24-Jan-2024 06:27, No significant change since last tracing Confirmed by Tessa Lerner [phone]) on 01/29/2024 3:50:58 PM  Echocardiogram: None  RADIOLOGY: NA  Risk Assessment/Calculations:   N/A   Labs:       Latest Ref Rng & Units 01/24/2024    2:36 AM 01/28/2021   10:07 AM 11/07/2019    9:41 AM  CBC  WBC 4.0 - 10.5 K/uL 6.8  4.1  4.2   Hemoglobin 12.0 - 15.0 g/dL 29.5  28.4  13.7   Hematocrit 36.0 - 46.0 % 43.6  41.8  42.9   Platelets 150 - 400 K/uL 243  189.0  190.0        Latest Ref Rng & Units 01/24/2024    2:36 AM 01/01/2024    8:49 AM 05/08/2023   11:33 AM  BMP  Glucose 70 - 99 mg/dL 161  096  97   BUN 6 - 20 mg/dL 13  15  15    Creatinine 0.44 - 1.00 mg/dL 0.45  4.09  8.11   Sodium 135 - 145 mmol/L 140   138  138   Potassium 3.5 - 5.1 mmol/L 3.3  3.6  3.8   Chloride 98 - 111 mmol/L 106  102  103   CO2 22 - 32 mmol/L 23  30  27    Calcium 8.9 - 10.3 mg/dL 9.2  9.3  9.3       Latest Ref Rng & Units 01/24/2024    4:25 AM 01/24/2024    2:36 AM 01/01/2024    8:49 AM  CMP  Glucose 70 - 99 mg/dL  914  782   BUN 6 - 20 mg/dL  13  15   Creatinine 9.56 - 1.00 mg/dL  2.13  0.86   Sodium 578 - 145 mmol/L  140  138   Potassium 3.5 - 5.1 mmol/L  3.3  3.6   Chloride 98 - 111 mmol/L  106  102   CO2 22 - 32 mmol/L  23  30   Calcium 8.9 - 10.3 mg/dL  9.2  9.3   Total Protein 6.5 - 8.1 g/dL 8.3   8.2   Total Bilirubin 0.0 - 1.2 mg/dL 0.7   0.5   Alkaline Phos 38 - 126 U/L 69   82   AST 15 - 41 U/L 27   28   ALT 0 - 44 U/L 24   26     Lab Results  Component Value Date   CHOL 218 (H) 01/01/2024   HDL 50.40 01/01/2024   LDLCALC 156 (H) 01/01/2024   TRIG 58.0 01/01/2024   CHOLHDL 4 01/01/2024   Cardiac Panel (last 3 results) No results for input(s): "CKTOTAL", "CKMB", "TROPONINIHS", "RELINDX" in the last 72 hours.   Physical Exam:    Today's Vitals   01/29/24 1533  BP: (!) 140/80  Pulse: 84  Resp: 16  SpO2: 98%  Weight: 217 lb (98.4 kg)  Height: 5\' 10"  (1.778 m)   Body mass index is 31.14 kg/m. Wt Readings from Last 3 Encounters:  01/29/24 217 lb (98.4 kg)  01/24/24 218 lb 14.7 oz (99.3 kg)  01/01/24 219 lb (99.3 kg)    Physical Exam  Constitutional: No distress.  hemodynamically stable  Neck: No JVD present.  Questionable goiter  Cardiovascular: Normal rate, regular rhythm, S1 normal and S2 normal. Exam reveals no gallop, no S3 and no S4.  No murmur heard. Pulmonary/Chest: Effort normal and breath sounds normal. No stridor. She has no wheezes. She has no rales.  Musculoskeletal:        General: No edema.     Cervical back: Neck supple.  Skin: Skin is warm.    Impression & Recommendation(s):  Impression:   ICD-10-CM   1. Precordial pain  R07.2 EKG 12-Lead     ECHOCARDIOGRAM COMPLETE    CT CARDIAC SCORING (SELF PAY ONLY)    EXERCISE TOLERANCE TEST (ETT)    2. Palpitations  R00.2 TSH    LONG TERM MONITOR (3-14  DAYS)    TSH    3. Type 2 diabetes mellitus with hyperglycemia, without long-term current use of insulin (HCC)  E11.65     4. Hyperlipidemia associated with type 2 diabetes mellitus (HCC)  E11.69    E78.5     5. Benign essential hypertension  I10     6. Class 1 obesity due to excess calories with serious comorbidity and body mass index (BMI) of 31.0 to 31.9 in adult  E66.811    E66.09    Z68.31        Recommendation(s):  Precordial pain Symptoms predominantly noncardiac-right-sided shoulder and chest pain. After initiation of statin therapy and cortisone injection. No prior workup. EKG today is nonischemic. Recent ED visit documentation reviewed including EKG, labs. Echo will be ordered to evaluate for structural heart disease and left ventricular systolic function. Exercising treadmill stress test for further risk stratification. Coronary calcium score for further risk stratification.  Palpitations On physical examination I question the presence of possible thyroid goiter. Check TSH. Recommend that she follows up with PCP. Currently feels tired and fatigued she is taking Lopressor 50 mg p.o. twice daily.  I have asked her to reduce this to 25 mg p.o. twice daily going forward.  And while she wears the 7 day Zio patch I have advised her to stop lopressorIf possible.  Type 2 diabetes mellitus with hyperglycemia, without long-term current use of insulin (HCC) Reemphasized importance of glycemic control. Currently on ACE inhibitors, statin therapy. Last hemoglobin A1c 7.2 in January 2025, [facility] database  Hyperlipidemia associated with type 2 diabetes mellitus (HCC) LDL is currently 156 mg/dL as of January 2025, [facility] database Was started on Lipitor 20 mg p.o. nightly with due to shoulder pain the medications patient has been  advised by PCP to hold it for now Patient states that when her discomfort is resolved she is advised to take 20 mg every other day by the prescribing provider.  Instead to be better if she takes Lipitor 10 mg p.o. daily, as a retrial. Recommend a goal LDL at least <70 mg/dL. May consider the addition of nonstatin therapy as an alternative.  Benign essential hypertension Office blood pressures are not at goal. Home blood pressures are also not at goal. I have asked her to call her PCP to uptitrate antihypertensive medications given her home and office blood pressure readings. Reemphasized importance of low-salt diet.  Class 1 obesity due to excess calories with serious comorbidity and body mass index (BMI) of 31.0 to 31.9 in adult Body mass index is 31.14 kg/m. I reviewed with her importance of diet, regular physical activity/exercise, weight loss.   Patient is educated on the importance of increasing physical activity gradually as tolerated with a goal of moderate intensity exercise for 30 minutes a day 5 days a week.  Orders Placed:  Orders Placed This Encounter  Procedures   CT CARDIAC SCORING (SELF PAY ONLY)    Standing Status:   Future    Expiration Date:   01/28/2025    Preferred imaging location?:   [place]    Is patient pregnant?:   No   TSH    Standing Status:   Future    Number of Occurrences:   1    Expected Date:   02/05/2024    Expiration Date:   01/28/2025   EXERCISE TOLERANCE TEST (ETT)    Standing Status:   Future    Expected Date:   02/05/2024    Expiration Date:  01/28/2025    Where should this test be performed:   [facility])    Stress with pharmacologic or treadmill ?:   Treadmill w/ exercise   LONG TERM MONITOR (3-14 DAYS)    Standing Status:   Future    Number of Occurrences:   1    Expected Date:   02/05/2024    Expiration Date:   01/28/2025    Where should this test be performed?:   [facility]    Does the patient have an implanted  cardiac device?:   No    Prescribed days of wear:   7    Type of enrollment:   Home Enrollment    Vendor::   Zio   EKG 12-Lead   ECHOCARDIOGRAM COMPLETE    Standing Status:   Future    Expiration Date:   01/28/2025    Where should this test be performed:   [facility])    Does the patient weigh less than or greater than 250 lbs?:   Patient weighs less than 250 lbs    Perflutren DEFINITY (image enhancing agent) should be administered unless hypersensitivity or allergy exist:   Administer Perflutren    Reason for exam-Echo:   Chest Pain  R07.9    Final Medication List:    Meds ordered this encounter  Medications   metoprolol tartrate (LOPRESSOR) 25 MG tablet    Sig: Take 1 tablet (25 mg total) by mouth 2 (two) times daily.    Dispense:  60 tablet    Refill:  0    Decreased to 25 mg twice daily    Medications Discontinued During This Encounter  Medication Reason   metoprolol tartrate (LOPRESSOR) 25 MG tablet      Current Outpatient Medications:    Apple Cider Vinegar 500 MG TABS, Take 500 mg by mouth every evening., Disp: , Rfl:    fluticasone (FLONASE) 50 MCG/ACT nasal spray, Place 1 spray into both nostrils daily., Disp: , Rfl:    glipiZIDE (GLUCOTROL XL) 5 MG 24 hr tablet, Take 1 tablet (5 mg total) by mouth daily with breakfast., Disp: 90 tablet, Rfl: 1   glucose blood (FREESTYLE LITE) test strip, USE TO TEST BLOOD SUGAR DAILY BEFORE BREAKFAST, Disp: 100 strip, Rfl: 3   ibuprofen (ADVIL) 200 MG tablet, Take 800 mg by mouth every 6 (six) hours as needed for mild pain (pain score 1-3) or moderate pain (pain score 4-6)., Disp: , Rfl:    Lancets (FREESTYLE) lancets, Check glucose once a day before breakfast, Disp: 100 each, Rfl: 3   lisinopril (ZESTRIL) 2.5 MG tablet, Take 1 tablet (2.5 mg total) by mouth daily. (Patient taking differently: Take 2.5 mg by mouth every evening.), Disp: 90 tablet, Rfl: 3   loratadine (CLARITIN) 10 MG tablet, Take 10 mg by mouth at  bedtime., Disp: , Rfl:    metFORMIN (GLUCOPHAGE-XR) 500 MG 24 hr tablet, Take 1 tablet (500 mg total) by mouth 2 (two) times daily with a meal. (Patient taking differently: Take 500 mg by mouth daily.), Disp: 180 tablet, Rfl: 1   methocarbamol (ROBAXIN) 500 MG tablet, Take 1 tablet (500 mg total) by mouth 2 (two) times daily., Disp: 20 tablet, Rfl: 0   omeprazole (PRILOSEC) 20 MG capsule, Take 1 capsule (20 mg total) by mouth daily., Disp: 90 capsule, Rfl: 1   oxymetazoline (AFRIN) 0.05 % nasal spray, Place 3 sprays into both nostrils 2 (two) times daily., Disp: , Rfl:  triamcinolone cream (KENALOG) 0.1 %, Apply topically to affected area 2 (two) times daily (Patient taking differently: Apply 1 Application topically as needed (itching).), Disp: 454 g, Rfl: 0   atorvastatin (LIPITOR) 20 MG tablet, Take 1 tablet (20 mg total) by mouth every evening. (Patient not taking: Reported on 01/29/2024), Disp: 90 tablet, Rfl: 1   metoprolol tartrate (LOPRESSOR) 25 MG tablet, Take 1 tablet (25 mg total) by mouth 2 (two) times daily., Disp: 60 tablet, Rfl: 0  Consent:   Informed Consent   Shared Decision Making/Informed Consent The risks [chest pain, shortness of breath, cardiac arrhythmias, dizziness, blood pressure fluctuations, myocardial infarction, stroke/transient ischemic attack, and life-threatening complications (estimated to be 1 in 10,000)], benefits (risk stratification, diagnosing coronary artery disease, treatment guidance) and alternatives of an exercise tolerance test were discussed in detail with Ms. Willard and she agrees to proceed.     Disposition:   3 month follow up .  Patient may be asked to follow-up sooner based on the results of the above-mentioned testing.  Her questions and concerns were addressed to her satisfaction. She voices understanding of the recommendations provided during this encounter.    Signed, Tessa Lerner, DO, [facility]  [facility]  [address]  [address] [place], [place] [address] 01/29/2024 5:48 PM

## 2024-01-29 NOTE — Patient Instructions (Addendum)
 Medication Instructions:  Your physician has recommended you make the following change in your medication:   DECREASE Metoprolol Tartrate (Lopressor) to 25 mg twice daily   **DO NOT take the Metoprolol Tartrate (Lopressor) while the heart monitor is on, you can restart the medication once you take off the heart monitor**  *If you need a refill on your cardiac medications before your next appointment, please call your pharmacy*  Lab Work: To be completed within the next week: TSH  If you have labs (blood work) drawn today and your tests are completely normal, you will receive your results only by: MyChart Message (if you have MyChart) OR A paper copy in the mail If you have any lab test that is abnormal or we need to change your treatment, we will call you to review the results.  Testing/Procedures: Your physician has requested that you have an echocardiogram. Echocardiography is a painless test that uses sound waves to create images of your heart. It provides your doctor with information about the size and shape of your heart and how well your heart's [place] and valves are working. This procedure takes approximately one hour. There are no restrictions for this procedure. Please do NOT wear cologne, perfume, aftershave, or lotions (deodorant is allowed). Please arrive 15 minutes prior to your appointment time.  Please note: We ask at that you not bring children with you during ultrasound (echo/ vascular) testing. Due to room size and safety concerns, children are not allowed in the ultrasound rooms during exams. Our front office staff cannot provide observation of children in our lobby area while testing is being conducted. An adult accompanying a patient to their appointment will only be allowed in the ultrasound room at the discretion of the ultrasound technician under special circumstances. We apologize for any inconvenience.    -------------------------------------------------------------------------------------------- Your physician has requested that you have a coronary calcium score performed. This is not covered by insurance and will be an out-of-pocket cost of approximately $99.   -------------------------------------------------------------------------------------------- Your physician has requested that you wear a Zio heart monitor for 7 days. This will be mailed to your home with instructions on how to apply the monitor and how to return it when finished. Please allow 2 weeks after returning the heart monitor before our office calls you with the results.   -------------------------------------------------------------------------------------------- Your physician has requested that you have an exercise tolerance test. For further information please visit [email or web address] Please also follow instruction sheet, as given.   Follow-Up: At [facility], you and your health needs are our priority.  As part of our continuing mission to provide you with exceptional heart care, we have created designated Provider Care Teams.  These Care Teams include your primary Cardiologist (physician) and Advanced Practice Providers (APPs -  Physician Assistants and Nurse Practitioners) who all work together to provide you with the care you need, when you need it.  Your next appointment:   3 month(s)  The format for your next appointment:   In Person  Provider:   Tessa Lerner, DO {    ZIO XT- Long Term Monitor Instructions     Your physician has requested you wear a ZIO patch monitor for 7 days.  This is a single patch monitor. [employer] supplies one patch monitor per enrollment. Additional  stickers are not available. Please do not apply patch if you will be having a Nuclear Stress Test,  Echocardiogram, Cardiac CT, MRI, or Chest Xray during the period you would be wearing the  monitor.  The patch cannot be worn during  these tests. You cannot remove and re-apply the  ZIO XT patch monitor.  Your ZIO patch monitor will be mailed 3 day USPS to your address on file. It may take 3-5 days  to receive your monitor after you have been enrolled.  Once you have received your monitor, please review the enclosed instructions. Your monitor  has already been registered assigning a specific monitor serial # to you.     Billing and Patient Assistance Program Information     We have supplied [employer] with any of your insurance information on file for billing purposes.  [employer] offers a sliding scale Patient Assistance Program for patients that do not have  insurance, or whose insurance does not completely cover the cost of the ZIO monitor.  You must apply for the Patient Assistance Program to qualify for this discounted rate.  To apply, please call [employer] at [phone], select option 4, select option 2, ask to apply for  Patient Assistance Program. Meredeth Ide will ask your household income, and how many people  are in your household. They will quote your out-of-pocket cost based on that information.  [employer] will also be able to set up a 35-month, interest-free payment plan if needed.     Applying the monitor     Shave hair from upper left chest.  Hold abrader disc by orange tab. Rub abrader in 40 strokes over the upper left chest as  indicated in your monitor instructions.  Clean area with 4 enclosed alcohol pads. Let dry.  Apply patch as indicated in monitor instructions. Patch will be placed under collarbone on left  side of chest with arrow pointing upward.  Rub patch adhesive wings for 2 minutes. Remove white label marked "1". Remove the white  label marked "2". Rub patch adhesive wings for 2 additional minutes.  While looking in a mirror, press and release button in center of patch. A small green light will  flash 3-4 times. This will be your only indicator that the monitor has been turned on.  Do not shower for  the first 24 hours. You may shower after the first 24 hours.  Press the button if you feel a symptom. You will hear a small click. Record Date, Time and  Symptom in the Patient Logbook.  When you are ready to remove the patch, follow instructions on the last 2 pages of Patient  Logbook. Stick patch monitor onto the last page of Patient Logbook.  Place Patient Logbook in the blue and white box. Use locking tab on box and tape box closed  securely. The blue and white box has prepaid postage on it. Please place it in the mailbox as  soon as possible. Your physician should have your test results approximately 7 days after the  monitor has been mailed back to [facility].  Call [facility] Customer Care at [phone] if you have questions regarding  your ZIO XT patch monitor. Call them immediately if you see an orange light blinking on your  monitor.  If your monitor falls off in less than 4 days, contact our Monitor department at [phone].  If your monitor becomes loose or falls off after 4 days call [employer] at [phone] for  suggestions on securing your monitor.

## 2024-01-31 ENCOUNTER — Other Ambulatory Visit: Payer: Self-pay | Admitting: Nurse Practitioner

## 2024-01-31 ENCOUNTER — Other Ambulatory Visit (HOSPITAL_COMMUNITY): Payer: Self-pay

## 2024-01-31 DIAGNOSIS — R002 Palpitations: Secondary | ICD-10-CM | POA: Diagnosis not present

## 2024-01-31 DIAGNOSIS — K219 Gastro-esophageal reflux disease without esophagitis: Secondary | ICD-10-CM

## 2024-02-01 DIAGNOSIS — R002 Palpitations: Secondary | ICD-10-CM

## 2024-02-01 LAB — TSH: TSH: 1 u[IU]/mL (ref 0.450–4.500)

## 2024-02-03 ENCOUNTER — Encounter: Payer: Self-pay | Admitting: Cardiology

## 2024-02-05 ENCOUNTER — Other Ambulatory Visit: Payer: Self-pay | Admitting: Nurse Practitioner

## 2024-02-05 ENCOUNTER — Other Ambulatory Visit (HOSPITAL_COMMUNITY): Payer: Self-pay

## 2024-02-05 ENCOUNTER — Telehealth: Payer: Self-pay | Admitting: Cardiology

## 2024-02-05 DIAGNOSIS — K219 Gastro-esophageal reflux disease without esophagitis: Secondary | ICD-10-CM

## 2024-02-05 NOTE — Telephone Encounter (Signed)
 Received [employer] FMLA form today.  I spoke with patient who said that she would be in this week to sign the ROI and pay the $29 form fee. Left form at front desk.

## 2024-02-06 ENCOUNTER — Other Ambulatory Visit (HOSPITAL_COMMUNITY): Payer: Self-pay

## 2024-02-06 MED ORDER — OMEPRAZOLE 20 MG PO CPDR
20.0000 mg | DELAYED_RELEASE_CAPSULE | Freq: Every day | ORAL | 1 refills | Status: DC
  Filled 2024-02-06: qty 90, 90d supply, fill #0
  Filled 2024-05-20: qty 90, 90d supply, fill #1

## 2024-02-06 NOTE — Telephone Encounter (Signed)
 Please talk to Amy [name] before accepting the [facility] paperwork.   Amy [name],  Please look into this before accepting these form. I have seen her once for chest pain consult.   [name] [place], DO, [facility]

## 2024-02-06 NOTE — Telephone Encounter (Signed)
 Patient came in today and signed the ROI and paid the $29 form fee.  Placed in Tolia's box.

## 2024-02-07 NOTE — Telephone Encounter (Signed)
 Spoke with pt over the phone and explained that from a cardiac standpoint, we are unable to fill out any FMLA paperwork. Pt was offered to have paperwork faxed to PCP to fill out but pt declined. Explained to pt that she would be refunded the payment for the paperwork.

## 2024-02-08 ENCOUNTER — Ambulatory Visit (HOSPITAL_COMMUNITY)
Admission: RE | Admit: 2024-02-08 | Discharge: 2024-02-08 | Disposition: A | Discharge status: Home / Self Care | Payer: Self-pay | Source: Ambulatory Visit | Attending: Cardiology | Admitting: Cardiology

## 2024-02-08 ENCOUNTER — Other Ambulatory Visit: Payer: Self-pay | Admitting: Physician Assistant

## 2024-02-08 ENCOUNTER — Other Ambulatory Visit (HOSPITAL_COMMUNITY): Payer: Self-pay

## 2024-02-08 ENCOUNTER — Telehealth: Payer: Self-pay | Admitting: Physician Assistant

## 2024-02-08 DIAGNOSIS — R072 Precordial pain: Secondary | ICD-10-CM | POA: Insufficient documentation

## 2024-02-08 MED ORDER — TRAMADOL HCL 50 MG PO TABS
50.0000 mg | ORAL_TABLET | Freq: Four times a day (QID) | ORAL | 0 refills | Status: DC | PRN
  Filled 2024-02-08: qty 30, 8d supply, fill #0

## 2024-02-08 NOTE — Telephone Encounter (Signed)
 Patient request something for pain states Ibuprofen not working. [facility]

## 2024-02-10 ENCOUNTER — Encounter: Payer: Self-pay | Admitting: Cardiology

## 2024-02-12 ENCOUNTER — Ambulatory Visit (INDEPENDENT_AMBULATORY_CARE_PROVIDER_SITE_OTHER): Admitting: Physician Assistant

## 2024-02-12 ENCOUNTER — Ambulatory Visit: Payer: 59 | Attending: Cardiology

## 2024-02-12 ENCOUNTER — Other Ambulatory Visit (HOSPITAL_COMMUNITY): Payer: Self-pay

## 2024-02-12 ENCOUNTER — Encounter: Payer: Self-pay | Admitting: Physician Assistant

## 2024-02-12 ENCOUNTER — Telehealth: Payer: Self-pay | Admitting: Nurse Practitioner

## 2024-02-12 DIAGNOSIS — M25511 Pain in right shoulder: Secondary | ICD-10-CM | POA: Diagnosis not present

## 2024-02-12 DIAGNOSIS — G8929 Other chronic pain: Secondary | ICD-10-CM

## 2024-02-12 DIAGNOSIS — R072 Precordial pain: Secondary | ICD-10-CM | POA: Diagnosis not present

## 2024-02-12 LAB — EXERCISE TOLERANCE TEST
Angina Index: 0
Estimated workload: 7
Exercise duration (min): 5 min
Exercise duration (sec): 0 s
MPHR: 164 {beats}/min
Peak HR: 153 {beats}/min
Percent HR: 93 %
RPE: 17
Rest HR: 90 {beats}/min

## 2024-02-12 MED ORDER — TRAMADOL HCL 50 MG PO TABS
50.0000 mg | ORAL_TABLET | Freq: Four times a day (QID) | ORAL | 0 refills | Status: AC | PRN
  Filled 2024-02-12 – 2024-02-15 (×2): qty 30, 8d supply, fill #0

## 2024-02-12 NOTE — Telephone Encounter (Signed)
 Pt scheduled via e2c2 a hosp f/up for 02/22/24 with Claris Gower, she was seen in ED on 01/24/24. I put on waiting list.
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
## 2024-02-15 ENCOUNTER — Telehealth: Payer: Self-pay | Admitting: Physician Assistant

## 2024-02-15 ENCOUNTER — Other Ambulatory Visit: Payer: Self-pay | Admitting: Physician Assistant

## 2024-02-15 ENCOUNTER — Other Ambulatory Visit (HOSPITAL_COMMUNITY): Payer: Self-pay

## 2024-02-15 DIAGNOSIS — R002 Palpitations: Secondary | ICD-10-CM | POA: Diagnosis not present

## 2024-02-15 NOTE — Telephone Encounter (Signed)
 Pt called requesting prednisone for her pains. Please send script to [facility]. Pt phone number is [phone].

## 2024-02-15 NOTE — Telephone Encounter (Signed)
 According to her last note sounds like possible steroid injection could have caused palpitations that sent her to ED.  I would hold off on prednisone for that reason.  Looks like MA sent in tramadol on 3/11 so would not recommend any other pain rx.  I would recommend otc tylenol/nsaids

## 2024-02-18 ENCOUNTER — Other Ambulatory Visit: Payer: Self-pay | Admitting: Physician Assistant

## 2024-02-18 DIAGNOSIS — G8929 Other chronic pain: Secondary | ICD-10-CM

## 2024-02-19 ENCOUNTER — Encounter: Payer: Self-pay | Admitting: Cardiology

## 2024-02-19 ENCOUNTER — Ambulatory Visit (HOSPITAL_COMMUNITY): Payer: 59 | Attending: Cardiology

## 2024-02-19 DIAGNOSIS — R072 Precordial pain: Secondary | ICD-10-CM

## 2024-02-19 LAB — ECHOCARDIOGRAM COMPLETE
Area-P 1/2: 3.61 cm2
S' Lateral: 3.3 cm

## 2024-02-22 ENCOUNTER — Encounter: Payer: Self-pay | Admitting: Nurse Practitioner

## 2024-02-22 ENCOUNTER — Ambulatory Visit: Admitting: Nurse Practitioner

## 2024-02-22 VITALS — BP 130/82 | HR 88 | Temp 97.5°F | Ht 70.0 in | Wt 215.0 lb

## 2024-02-22 DIAGNOSIS — M25511 Pain in right shoulder: Secondary | ICD-10-CM | POA: Diagnosis not present

## 2024-02-22 DIAGNOSIS — G8929 Other chronic pain: Secondary | ICD-10-CM

## 2024-02-22 NOTE — Assessment & Plan Note (Signed)
 No pain control with tylenol or muscle relaxant or ibuprofen or tramadol She took family's oral prednisone 50mg  x 5days. She is requesting for refill.  I advised against use of another person's medication. I advised against continuation of oral prednisone due to risk of elevated glucose. I recommended use of gabapentin or lyrica for pain management. She declined medication. Advised to f/up with ortho

## 2024-02-22 NOTE — Progress Notes (Signed)
 Established Patient Visit  Patient: Amy [name]   DOB: 27-Dec-1965   58 y.o. Female  MRN: [id number] Visit Date: 02/22/2024  Subjective:    Chief Complaint  Patient presents with   Hospital follow up   Shoulder Pain    Right shoulder pain    Shoulder Pain    Resolved chest pain since ED visit. She had appointment with cardiology. Negative cardiac work up.  Right shoulder pain No pain control with tylenol or muscle relaxant or ibuprofen or tramadol She took family's oral prednisone 50mg  x 5days. She is requesting for refill.  I advised against use of another person's medication. I advised against continuation of oral prednisone due to risk of elevated glucose. I recommended use of gabapentin or lyrica for pain management. She declined medication. Advised to f/up with ortho  Reviewed medical, surgical, and social history today  Medications: Outpatient Medications Prior to Visit  Medication Sig   Apple Cider Vinegar 500 MG TABS Take 500 mg by mouth every evening.   atorvastatin (LIPITOR) 20 MG tablet Take 1 tablet (20 mg total) by mouth every evening. (Patient taking differently: Take 10 mg by mouth every evening. Take half tablet by mouth daily)   fluticasone (FLONASE) 50 MCG/ACT nasal spray Place 1 spray into both nostrils daily.   glipiZIDE (GLUCOTROL XL) 5 MG 24 hr tablet Take 1 tablet (5 mg total) by mouth daily with breakfast.   glucose blood (FREESTYLE LITE) test strip USE TO TEST BLOOD SUGAR DAILY BEFORE BREAKFAST   ibuprofen (ADVIL) 200 MG tablet Take 800 mg by mouth every 6 (six) hours as needed for mild pain (pain score 1-3) or moderate pain (pain score 4-6).   Lancets (FREESTYLE) lancets Check glucose once a day before breakfast   lisinopril (ZESTRIL) 2.5 MG tablet Take 1 tablet (2.5 mg total) by mouth daily. (Patient taking differently: Take 2.5 mg by mouth every evening.)   loratadine (CLARITIN) 10 MG tablet Take 10 mg by mouth at bedtime.    metFORMIN (GLUCOPHAGE-XR) 500 MG 24 hr tablet Take 1 tablet (500 mg total) by mouth 2 (two) times daily with a meal. (Patient taking differently: Take 500 mg by mouth daily with breakfast.)   methocarbamol (ROBAXIN) 500 MG tablet Take 1 tablet (500 mg total) by mouth 2 (two) times daily.   metoprolol tartrate (LOPRESSOR) 25 MG tablet Take 1 tablet (25 mg total) by mouth 2 (two) times daily.   omeprazole (PRILOSEC) 20 MG capsule Take 1 capsule (20 mg total) by mouth daily.   oxymetazoline (AFRIN) 0.05 % nasal spray Place 3 sprays into both nostrils 2 (two) times daily.   traMADol (ULTRAM) 50 MG tablet Take 1 tablet (50 mg total) by mouth every 6 (six) hours as needed.   triamcinolone cream (KENALOG) 0.1 % Apply topically to affected area 2 (two) times daily (Patient taking differently: Apply 1 Application topically as needed (itching).)   No facility-administered medications prior to visit.   Reviewed past medical and social history.   ROS per HPI above      Objective:  BP 130/82 (BP Location: Left Arm, Patient Position: Sitting, Cuff Size: Normal)   Pulse 88   Temp (!) 97.5 F (36.4 C) (Temporal)   Ht 5\' 10"  (1.778 m)   Wt 215 lb (97.5 kg)   LMP 06/04/2019   SpO2 98%   BMI 30.85 kg/m      Physical Exam Vitals  and nursing note reviewed.  Cardiovascular:     Rate and Rhythm: Normal rate.     Pulses: Normal pulses.  Pulmonary:     Effort: Pulmonary effort is normal.  Neurological:     Mental Status: She is alert and oriented to person, place, and time.     No results found for any visits on 02/22/24.    Assessment & Plan:    Problem List Items Addressed This Visit     Right shoulder pain - Primary   No pain control with tylenol or muscle relaxant or ibuprofen or tramadol She took family's oral prednisone 50mg  x 5days. She is requesting for refill.  I advised against use of another person's medication. I advised against continuation of oral prednisone due to risk of  elevated glucose. I recommended use of gabapentin or lyrica for pain management. She declined medication. Advised to f/up with ortho      Return in about 2 months (around 04/23/2024) for HTN, DM, hyperlipidemia (fasting).     Alysia Penna, NP

## 2024-02-22 NOTE — Patient Instructions (Signed)
 Let me know if you change your mind about use of gabapentin or lyrica.

## 2024-02-24 ENCOUNTER — Encounter: Payer: Self-pay | Admitting: Cardiology

## 2024-02-28 ENCOUNTER — Telehealth: Payer: Self-pay | Admitting: Physician Assistant

## 2024-02-28 NOTE — Telephone Encounter (Signed)
 Received call from patient. She stated the [employer] form is just needed for intermittent leave not continuous. I revised form and faxed to [employer] for intermittent.

## 2024-03-03 ENCOUNTER — Other Ambulatory Visit (HOSPITAL_COMMUNITY): Payer: Self-pay

## 2024-03-07 ENCOUNTER — Ambulatory Visit
Admission: RE | Admit: 2024-03-07 | Discharge: 2024-03-07 | Disposition: A | Discharge status: Home / Self Care | Source: Ambulatory Visit | Attending: Physician Assistant | Admitting: Physician Assistant

## 2024-03-07 DIAGNOSIS — M7551 Bursitis of right shoulder: Secondary | ICD-10-CM | POA: Diagnosis not present

## 2024-03-07 DIAGNOSIS — G8929 Other chronic pain: Secondary | ICD-10-CM

## 2024-03-07 DIAGNOSIS — M19011 Primary osteoarthritis, right shoulder: Secondary | ICD-10-CM | POA: Diagnosis not present

## 2024-03-07 DIAGNOSIS — M25511 Pain in right shoulder: Secondary | ICD-10-CM | POA: Diagnosis not present

## 2024-03-07 MED ORDER — IOPAMIDOL (ISOVUE-M 200) INJECTION 41%
10.0000 mL | Freq: Once | INTRAMUSCULAR | Status: AC
  Administered 2024-03-07: 10 mL via INTRA_ARTICULAR

## 2024-03-17 ENCOUNTER — Ambulatory Visit (INDEPENDENT_AMBULATORY_CARE_PROVIDER_SITE_OTHER): Admitting: Orthopedic Surgery

## 2024-03-17 ENCOUNTER — Other Ambulatory Visit (HOSPITAL_COMMUNITY): Payer: Self-pay

## 2024-03-17 DIAGNOSIS — G8929 Other chronic pain: Secondary | ICD-10-CM | POA: Diagnosis not present

## 2024-03-17 DIAGNOSIS — M25511 Pain in right shoulder: Secondary | ICD-10-CM | POA: Diagnosis not present

## 2024-03-17 MED ORDER — PREDNISONE 5 MG (21) PO TBPK
ORAL_TABLET | ORAL | 0 refills | Status: DC
  Filled 2024-03-17: qty 21, 6d supply, fill #0
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
## 2024-03-18 ENCOUNTER — Other Ambulatory Visit (HOSPITAL_COMMUNITY): Payer: Self-pay

## 2024-03-18 ENCOUNTER — Encounter: Payer: Self-pay | Admitting: Orthopedic Surgery

## 2024-04-18 ENCOUNTER — Other Ambulatory Visit (HOSPITAL_COMMUNITY): Payer: Self-pay

## 2024-04-18 ENCOUNTER — Other Ambulatory Visit: Payer: Self-pay

## 2024-04-29 ENCOUNTER — Encounter (HOSPITAL_COMMUNITY): Payer: Self-pay

## 2024-04-29 ENCOUNTER — Other Ambulatory Visit (HOSPITAL_COMMUNITY): Payer: Self-pay

## 2024-04-29 ENCOUNTER — Other Ambulatory Visit: Payer: Self-pay | Admitting: Orthopedic Surgery

## 2024-04-29 NOTE — Telephone Encounter (Signed)
 no

## 2024-04-30 ENCOUNTER — Ambulatory Visit: Attending: Cardiology | Admitting: Cardiology

## 2024-04-30 ENCOUNTER — Encounter: Payer: Self-pay | Admitting: Cardiology

## 2024-04-30 VITALS — BP 120/88 | HR 96 | Resp 16 | Ht 70.0 in | Wt 219.0 lb

## 2024-04-30 DIAGNOSIS — R072 Precordial pain: Secondary | ICD-10-CM | POA: Diagnosis not present

## 2024-04-30 DIAGNOSIS — R002 Palpitations: Secondary | ICD-10-CM

## 2024-04-30 DIAGNOSIS — I10 Essential (primary) hypertension: Secondary | ICD-10-CM | POA: Diagnosis not present

## 2024-04-30 DIAGNOSIS — E1165 Type 2 diabetes mellitus with hyperglycemia: Secondary | ICD-10-CM | POA: Diagnosis not present

## 2024-04-30 DIAGNOSIS — E785 Hyperlipidemia, unspecified: Secondary | ICD-10-CM | POA: Diagnosis not present

## 2024-04-30 DIAGNOSIS — E1169 Type 2 diabetes mellitus with other specified complication: Secondary | ICD-10-CM

## 2024-04-30 NOTE — Progress Notes (Signed)
 Cardiology Office Note:  .   Date:  04/30/2024  ID:  Amy [name], DOB 1966/10/03, MRN [id number] PCP:  Kandace Organ, NP  Former Cardiology Providers: N/A [facility] Providers Cardiologist:  Olinda Bertrand, DO , [facility] (established care 01/29/24) Electrophysiologist:  None  Click to update primary MD,subspecialty MD or APP then REFRESH:1}    Chief Complaint  Patient presents with   Follow-up    Reevaluation of chest pain and discuss test results.    History of Present Illness: .   Amy [name] is a 58 y.o. African-American female whose past medical history and cardiovascular risk factors includes: Non-insulin-dependent diabetes mellitus type 2, hypertension.  In February 2025 patient went to [facility] ED for evaluation/concerns for chest pain.  She was recommended to have at least an echocardiogram prior to discharge but due to prolonged wait times she decided to go home and proceed outpatient workup.  Patient presented to the office on January 29, 2024 with concerns for precordial pain and palpitations.  Her symptoms of precordial pain were predominately noncardiac but given her risk factors shared decision was to proceed with a coronary calcium  score as well as GXT and echocardiogram.  Given her palpitations she did undergo a cardiac monitor to evaluate for dysrhythmias as well.  Since last office visit she has not had any recurrence of chest pain or shortness of breath.  She still feels tired and fatigue.  I reviewed the results of the echocardiogram, coronary calcium  score, GXT and cardiac monitor with her in detail at today's office visit.  Results noted below for further reference.  Patient tried metoprolol  for short period of time but since her symptoms overall improved she has stopped taking it which is very acceptable.  Review of Systems: .   Review of Systems  Constitutional: Positive for malaise/fatigue.  Cardiovascular:  Negative for chest pain,  claudication, irregular heartbeat, leg swelling, near-syncope, orthopnea, palpitations, paroxysmal nocturnal dyspnea and syncope.  Respiratory:  Negative for shortness of breath.   Hematologic/Lymphatic: Negative for bleeding problem.    Studies Reviewed:   Echocardiogram: 02/19/2024 1. Left ventricular ejection fraction, by estimation, is 60 to 65%. Left ventricular ejection fraction by 3D volume is 63 %. The left ventricle has normal function. The left ventricle has no regional wall motion abnormalities. Left ventricular diastolic parameters were normal. 2. Right ventricular systolic function is normal. The right ventricular size is normal. 3. The mitral valve is normal in structure. Trivial mitral valve regurgitation. No evidence of mitral stenosis. 4. The aortic valve is tricuspid. Aortic valve regurgitation is not visualized. No aortic stenosis is present. 5. The inferior vena cava is normal in size with greater than 50% respiratory variability, suggesting right atrial pressure of 3 mmHg.   GXT 02/2024 Conclusion: Exercise stress test: Clinically negative, electrically negative for ischemia. Note motion artifact throughout study limits interpretation some.   Coronary artery calcium  score: 02/08/2024 Total CAC 0 AU Noncardiac findings: No significant extracardiac findings within the visualized chest.   Cardiac monitor (Zio Patch): 02/01/2024 - 02/09/2024 Dominant rhythm sinus, followed by tachycardia (burden 23%). Heart rate 59-137 bpm. Avg HR 91 bpm. No atrial fibrillation detected during the monitoring period. No supraventricular tachycardia, ventricular tachycardia, high grade AV block, pauses (3 seconds or longer). Total supraventricular ectopic burden <1%. Total ventricular ectopic burden <1%. Patient triggered events: 5. Underlying rhythm sinus without ectopy.   RADIOLOGY: NA  Risk Assessment/Calculations:   N/A   Labs:  Latest Ref Rng & Units 01/24/2024     2:36 AM 01/28/2021   10:07 AM 11/07/2019    9:41 AM  CBC  WBC 4.0 - 10.5 K/uL 6.8  4.1  4.2   Hemoglobin 12.0 - 15.0 g/dL 11.9  14.7  82.9   Hematocrit 36.0 - 46.0 % 43.6  41.8  42.9   Platelets 150 - 400 K/uL 243  189.0  190.0        Latest Ref Rng & Units 01/24/2024    2:36 AM 01/01/2024    8:49 AM 05/08/2023   11:33 AM  BMP  Glucose 70 - 99 mg/dL 562  130  97   BUN 6 - 20 mg/dL 13  15  15    Creatinine 0.44 - 1.00 mg/dL 8.65  7.84  6.96   Sodium 135 - 145 mmol/L 140  138  138   Potassium 3.5 - 5.1 mmol/L 3.3  3.6  3.8   Chloride 98 - 111 mmol/L 106  102  103   CO2 22 - 32 mmol/L 23  30  27    Calcium  8.9 - 10.3 mg/dL 9.2  9.3  9.3       Latest Ref Rng & Units 01/24/2024    4:25 AM 01/24/2024    2:36 AM 01/01/2024    8:49 AM  CMP  Glucose 70 - 99 mg/dL  295  284   BUN 6 - 20 mg/dL  13  15   Creatinine 1.32 - 1.00 mg/dL  4.40  1.02   Sodium 725 - 145 mmol/L  140  138   Potassium 3.5 - 5.1 mmol/L  3.3  3.6   Chloride 98 - 111 mmol/L  106  102   CO2 22 - 32 mmol/L  23  30   Calcium  8.9 - 10.3 mg/dL  9.2  9.3   Total Protein 6.5 - 8.1 g/dL 8.3   8.2   Total Bilirubin 0.0 - 1.2 mg/dL 0.7   0.5   Alkaline Phos 38 - 126 U/L 69   82   AST 15 - 41 U/L 27   28   ALT 0 - 44 U/L 24   26     Lab Results  Component Value Date   CHOL 218 (H) 01/01/2024   HDL 50.40 01/01/2024   LDLCALC 156 (H) 01/01/2024   TRIG 58.0 01/01/2024   CHOLHDL 4 01/01/2024   Cardiac Panel (last 3 results) No results for input(s): "CKTOTAL", "CKMB", "TROPONINIHS", "RELINDX" in the last 72 hours.   Physical Exam:    Today's Vitals   04/30/24 1605  BP: 120/88  Pulse: 96  Resp: 16  SpO2: 96%  Weight: 219 lb (99.3 kg)  Height: 5\' 10"  (1.778 m)   Body mass index is 31.42 kg/m. Wt Readings from Last 3 Encounters:  04/30/24 219 lb (99.3 kg)  02/22/24 215 lb (97.5 kg)  01/29/24 217 lb (98.4 kg)    Physical Exam  Constitutional: No distress.  hemodynamically stable  Neck: No JVD present.   Questionable goiter  Cardiovascular: Normal rate, regular rhythm, S1 normal and S2 normal. Exam reveals no gallop, no S3 and no S4.  No murmur heard. Pulmonary/Chest: Effort normal and breath sounds normal. No stridor. She has no wheezes. She has no rales.  Musculoskeletal:        General: No edema.     Cervical back: Neck supple.  Skin: Skin is warm.    Impression & Recommendation(s):  Impression:   ICD-10-CM  1. Precordial pain  R07.2     2. Palpitations  R00.2     3. Type 2 diabetes mellitus with hyperglycemia, without long-term current use of insulin (HCC)  E11.65     4. Hyperlipidemia associated with type 2 diabetes mellitus (HCC)  E11.69    E78.5     5. Benign essential hypertension  I10      Recommendation(s):  Precordial pain Asymptomatic since last office visit. Total coronary calcium  score is 0. GXT is low risk study Echocardiogram: Notes preserved LVEF no significant valvular heart disease. Reemphasized importance of improving modifiable cardiovascular risk factors.  Palpitations Cardiac monitor results reviewed with the patient in detail. Since she is asymptomatic shared decision was to hold off on pharmacological therapy at this time which is very reasonable. Monitor for now.    Type 2 diabetes mellitus with hyperglycemia, without long-term current use of insulin (HCC) Reemphasized importance of glycemic control. Currently on ACE inhibitors, statin therapy. Last hemoglobin A1c 7.2 in January 2025, [facility] database  Hyperlipidemia associated with type 2 diabetes mellitus (HCC) LDL is currently 156 mg/dL as of January 2025, [facility] database Currently on Lipitor 20 mg p.o. nightly. Recommend a goal LDL at least <70 mg/dL. Cardiology following peripherally, managed by primary care provider.  Benign essential hypertension Office blood pressures are at goal. Continue lisinopril  2.5 mg p.o. daily. Reemphasized importance of low-salt diet.  Class 1 obesity due to  excess calories with serious comorbidity and body mass index (BMI) of 31.0 to 31.9 in adult Body mass index is 31.42 kg/m. I reviewed with her importance of diet, regular physical activity/exercise, weight loss.   Patient is educated on the importance of increasing physical activity gradually as tolerated with a goal of moderate intensity exercise for 30 minutes a day 5 days a week.  Orders Placed:  No orders of the defined types were placed in this encounter.   Final Medication List:    No orders of the defined types were placed in this encounter.   Medications Discontinued During This Encounter  Medication Reason   predniSONE  (STERAPRED UNI-PAK 21 TAB) 5 MG (21) TBPK tablet Patient Preference   metoprolol  tartrate (LOPRESSOR ) 25 MG tablet Patient Preference     Current Outpatient Medications:    Apple Cider Vinegar 500 MG TABS, Take 500 mg by mouth every evening., Disp: , Rfl:    atorvastatin  (LIPITOR) 20 MG tablet, Take 1 tablet (20 mg total) by mouth every evening. (Patient taking differently: Take 10 mg by mouth every evening. Take half tablet by mouth daily), Disp: 90 tablet, Rfl: 1   fluticasone (FLONASE) 50 MCG/ACT nasal spray, Place 1 spray into both nostrils daily., Disp: , Rfl:    glipiZIDE  (GLUCOTROL  XL) 5 MG 24 hr tablet, Take 1 tablet (5 mg total) by mouth daily with breakfast., Disp: 90 tablet, Rfl: 1   glucose blood (FREESTYLE LITE) test strip, USE TO TEST BLOOD SUGAR DAILY BEFORE BREAKFAST, Disp: 100 strip, Rfl: 3   ibuprofen (ADVIL) 200 MG tablet, Take 800 mg by mouth every 6 (six) hours as needed for mild pain (pain score 1-3) or moderate pain (pain score 4-6)., Disp: , Rfl:    Lancets (FREESTYLE) lancets, Check glucose once a day before breakfast, Disp: 100 each, Rfl: 3   lisinopril  (ZESTRIL ) 2.5 MG tablet, Take 1 tablet (2.5 mg total) by mouth daily. (Patient taking differently: Take 2.5 mg by mouth every evening.), Disp: 90 tablet, Rfl: 3   loratadine (CLARITIN) 10  MG tablet, Take  10 mg by mouth at bedtime., Disp: , Rfl:    metFORMIN  (GLUCOPHAGE -XR) 500 MG 24 hr tablet, Take 1 tablet (500 mg total) by mouth 2 (two) times daily with a meal. (Patient taking differently: Take 500 mg by mouth daily with breakfast.), Disp: 180 tablet, Rfl: 1   methocarbamol  (ROBAXIN ) 500 MG tablet, Take 1 tablet (500 mg total) by mouth 2 (two) times daily., Disp: 20 tablet, Rfl: 0   omeprazole  (PRILOSEC) 20 MG capsule, Take 1 capsule (20 mg total) by mouth daily., Disp: 90 capsule, Rfl: 1   oxymetazoline (AFRIN) 0.05 % nasal spray, Place 3 sprays into both nostrils 2 (two) times daily., Disp: , Rfl:    traMADol  (ULTRAM ) 50 MG tablet, Take 1 tablet (50 mg total) by mouth every 6 (six) hours as needed., Disp: 30 tablet, Rfl: 0   triamcinolone  cream (KENALOG ) 0.1 %, Apply topically to affected area 2 (two) times daily (Patient taking differently: Apply 1 Application topically as needed (itching).), Disp: 454 g, Rfl: 0  Consent:   NA    Disposition:   As needed follow-up Patient may be asked to follow-up sooner based on the results of the above-mentioned testing.  Her questions and concerns were addressed to her satisfaction. She voices understanding of the recommendations provided during this encounter.    Signed, Awilda Bogus, [facility]  A Division of [facility] [address] [place], [place] [address]  [place], [place] [address] 04/30/2024 5:35 PM

## 2024-04-30 NOTE — Patient Instructions (Signed)
 Medication Instructions:  No medication changes were made at this visit. Continue current regimen.   *If you need a refill on your cardiac medications before your next appointment, please call your pharmacy*  Lab Work: None ordered today. If you have labs (blood work) drawn today and your tests are completely normal, you will receive your results only by: MyChart Message (if you have MyChart) OR A paper copy in the mail If you have any lab test that is abnormal or we need to change your treatment, we will call you to review the results.  Testing/Procedures: None ordered today.  Follow-Up: At [facility], you and your health needs are our priority.  As part of our continuing mission to provide you with exceptional heart care, our providers are all part of one team.  This team includes your primary Cardiologist (physician) and Advanced Practice Providers or APPs (Physician Assistants and Nurse Practitioners) who all work together to provide you with the care you need, when you need it.  Your next appointment:   As needed  Provider:   Olinda Bertrand, DO

## 2024-05-06 ENCOUNTER — Encounter: Payer: Self-pay | Admitting: Orthopedic Surgery

## 2024-05-07 ENCOUNTER — Other Ambulatory Visit (HOSPITAL_COMMUNITY): Payer: Self-pay

## 2024-05-07 MED ORDER — PREDNISONE 10 MG (21) PO TBPK
ORAL_TABLET | ORAL | 0 refills | Status: DC
  Filled 2024-05-07: qty 21, 6d supply, fill #0

## 2024-05-07 NOTE — Telephone Encounter (Signed)
 Please send in Medrol  Dosepak 6-day course.  Thanks

## 2024-05-07 NOTE — Telephone Encounter (Signed)
 Rx sent.

## 2024-05-16 ENCOUNTER — Ambulatory Visit: Payer: Self-pay | Admitting: Nurse Practitioner

## 2024-05-16 ENCOUNTER — Other Ambulatory Visit (INDEPENDENT_AMBULATORY_CARE_PROVIDER_SITE_OTHER)

## 2024-05-16 ENCOUNTER — Other Ambulatory Visit (HOSPITAL_COMMUNITY): Payer: Self-pay

## 2024-05-16 DIAGNOSIS — E1169 Type 2 diabetes mellitus with other specified complication: Secondary | ICD-10-CM

## 2024-05-16 DIAGNOSIS — E1165 Type 2 diabetes mellitus with hyperglycemia: Secondary | ICD-10-CM

## 2024-05-16 DIAGNOSIS — E785 Hyperlipidemia, unspecified: Secondary | ICD-10-CM

## 2024-05-16 LAB — LIPID PANEL
Cholesterol: 208 mg/dL — ABNORMAL HIGH (ref 0–200)
HDL: 55 mg/dL (ref >39.00)
LDL Cholesterol: 142 mg/dL — ABNORMAL HIGH (ref 0–99)
NonHDL: 152.76
Total CHOL/HDL Ratio: 4
Triglycerides: 52 mg/dL (ref 0.0–149.0)
VLDL: 10.4 mg/dL (ref 0.0–40.0)

## 2024-05-16 LAB — HEMOGLOBIN A1C: Hgb A1c MFr Bld: 7.2 % — ABNORMAL HIGH (ref 4.6–6.5)

## 2024-05-16 MED ORDER — ATORVASTATIN CALCIUM 40 MG PO TABS
40.0000 mg | ORAL_TABLET | Freq: Every evening | ORAL | 1 refills | Status: DC
  Filled 2024-05-16: qty 90, 90d supply, fill #0

## 2024-05-16 MED ORDER — GLIPIZIDE ER 10 MG PO TB24
10.0000 mg | ORAL_TABLET | Freq: Every day | ORAL | 1 refills | Status: DC
  Filled 2024-05-16: qty 90, 90d supply, fill #0
  Filled 2024-08-22: qty 90, 90d supply, fill #1

## 2024-05-20 ENCOUNTER — Other Ambulatory Visit (HOSPITAL_COMMUNITY): Payer: Self-pay

## 2024-06-13 ENCOUNTER — Other Ambulatory Visit (HOSPITAL_COMMUNITY): Payer: Self-pay

## 2024-06-13 ENCOUNTER — Ambulatory Visit (HOSPITAL_COMMUNITY)
Admission: EM | Admit: 2024-06-13 | Discharge: 2024-06-13 | Disposition: A | Discharge status: Home / Self Care | Attending: Physician Assistant | Admitting: Physician Assistant

## 2024-06-13 ENCOUNTER — Encounter (HOSPITAL_COMMUNITY): Payer: Self-pay | Admitting: Emergency Medicine

## 2024-06-13 DIAGNOSIS — J0100 Acute maxillary sinusitis, unspecified: Secondary | ICD-10-CM | POA: Diagnosis not present

## 2024-06-13 MED ORDER — AMOXICILLIN-POT CLAVULANATE 875-125 MG PO TABS
1.0000 | ORAL_TABLET | Freq: Two times a day (BID) | ORAL | 0 refills | Status: AC
  Filled 2024-06-13: qty 14, 7d supply, fill #0

## 2024-06-13 NOTE — ED Triage Notes (Signed)
 Pt st's she has had what she thinks is a sinus infection for approx 3 weeks.  C/o nasal congestion and left ear pain  Pt denies any cough or facial pain
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
## 2024-06-25 ENCOUNTER — Other Ambulatory Visit (HOSPITAL_COMMUNITY): Payer: Self-pay

## 2024-06-25 ENCOUNTER — Other Ambulatory Visit: Payer: Self-pay | Admitting: Nurse Practitioner

## 2024-06-25 DIAGNOSIS — E1165 Type 2 diabetes mellitus with hyperglycemia: Secondary | ICD-10-CM

## 2024-06-25 MED ORDER — LISINOPRIL 2.5 MG PO TABS
2.5000 mg | ORAL_TABLET | Freq: Every day | ORAL | 0 refills | Status: DC
  Filled 2024-06-25: qty 30, 30d supply, fill #0

## 2024-06-25 NOTE — Telephone Encounter (Signed)
 MyChart message sent to patient to get scheduled for a follow up appointment

## 2024-07-11 ENCOUNTER — Other Ambulatory Visit: Payer: Self-pay | Admitting: Nurse Practitioner

## 2024-07-11 DIAGNOSIS — Z1231 Encounter for screening mammogram for malignant neoplasm of breast: Secondary | ICD-10-CM

## 2024-07-29 ENCOUNTER — Ambulatory Visit
Admission: RE | Admit: 2024-07-29 | Discharge: 2024-07-29 | Disposition: A | Discharge status: Home / Self Care | Source: Ambulatory Visit | Attending: Nurse Practitioner | Admitting: Nurse Practitioner

## 2024-07-29 DIAGNOSIS — Z1231 Encounter for screening mammogram for malignant neoplasm of breast: Secondary | ICD-10-CM

## 2024-08-22 ENCOUNTER — Other Ambulatory Visit (HOSPITAL_COMMUNITY): Payer: Self-pay

## 2024-08-22 ENCOUNTER — Other Ambulatory Visit

## 2024-08-22 ENCOUNTER — Other Ambulatory Visit: Payer: Self-pay | Admitting: Nurse Practitioner

## 2024-08-22 DIAGNOSIS — E1165 Type 2 diabetes mellitus with hyperglycemia: Secondary | ICD-10-CM | POA: Diagnosis not present

## 2024-08-22 LAB — HEMOGLOBIN A1C: Hgb A1c MFr Bld: 7.2 % — ABNORMAL HIGH (ref 4.6–6.5)

## 2024-08-22 MED ORDER — METFORMIN HCL ER 750 MG PO TB24
750.0000 mg | ORAL_TABLET | Freq: Two times a day (BID) | ORAL | 2 refills | Status: DC
  Filled 2024-08-22: qty 60, 30d supply, fill #0

## 2024-08-22 MED ORDER — LISINOPRIL 2.5 MG PO TABS
2.5000 mg | ORAL_TABLET | Freq: Every day | ORAL | 0 refills | Status: DC
  Filled 2024-08-22: qty 42, 42d supply, fill #0

## 2024-08-22 NOTE — Telephone Encounter (Signed)
 Patient must keep upcoming 10/03/24 appointment for continuation of #90 with refills

## 2024-08-22 NOTE — Addendum Note (Signed)
 Addended by: KATHEEN GARDEN L on: 08/22/2024 03:54 PM   Modules accepted: Orders

## 2024-10-03 ENCOUNTER — Other Ambulatory Visit (HOSPITAL_COMMUNITY): Payer: Self-pay

## 2024-10-03 ENCOUNTER — Ambulatory Visit: Admitting: Nurse Practitioner

## 2024-10-03 ENCOUNTER — Encounter: Payer: Self-pay | Admitting: Nurse Practitioner

## 2024-10-03 ENCOUNTER — Ambulatory Visit: Payer: Self-pay | Admitting: Nurse Practitioner

## 2024-10-03 ENCOUNTER — Other Ambulatory Visit: Payer: Self-pay

## 2024-10-03 VITALS — BP 124/80 | HR 67 | Temp 95.7°F | Ht 70.0 in | Wt 215.0 lb

## 2024-10-03 DIAGNOSIS — K219 Gastro-esophageal reflux disease without esophagitis: Secondary | ICD-10-CM | POA: Diagnosis not present

## 2024-10-03 DIAGNOSIS — Z7984 Long term (current) use of oral hypoglycemic drugs: Secondary | ICD-10-CM | POA: Diagnosis not present

## 2024-10-03 DIAGNOSIS — E1165 Type 2 diabetes mellitus with hyperglycemia: Secondary | ICD-10-CM | POA: Diagnosis not present

## 2024-10-03 DIAGNOSIS — E785 Hyperlipidemia, unspecified: Secondary | ICD-10-CM

## 2024-10-03 DIAGNOSIS — L309 Dermatitis, unspecified: Secondary | ICD-10-CM

## 2024-10-03 DIAGNOSIS — Z0001 Encounter for general adult medical examination with abnormal findings: Secondary | ICD-10-CM

## 2024-10-03 DIAGNOSIS — J320 Chronic maxillary sinusitis: Secondary | ICD-10-CM

## 2024-10-03 DIAGNOSIS — E1169 Type 2 diabetes mellitus with other specified complication: Secondary | ICD-10-CM | POA: Diagnosis not present

## 2024-10-03 DIAGNOSIS — Z0000 Encounter for general adult medical examination without abnormal findings: Secondary | ICD-10-CM

## 2024-10-03 LAB — COMPREHENSIVE METABOLIC PANEL WITH GFR
ALT: 20 U/L (ref 0–35)
AST: 21 U/L (ref 0–37)
Albumin: 4.2 g/dL (ref 3.5–5.2)
Alkaline Phosphatase: 80 U/L (ref 39–117)
BUN: 14 mg/dL (ref 6–23)
CO2: 27 meq/L (ref 19–32)
Calcium: 9.1 mg/dL (ref 8.4–10.5)
Chloride: 104 meq/L (ref 96–112)
Creatinine, Ser: 0.59 mg/dL (ref 0.40–1.20)
GFR: 100.28 mL/min (ref >60.00)
Glucose, Bld: 101 mg/dL — ABNORMAL HIGH (ref 70–99)
Potassium: 3.7 meq/L (ref 3.5–5.1)
Sodium: 138 meq/L (ref 135–145)
Total Bilirubin: 0.5 mg/dL (ref 0.2–1.2)
Total Protein: 7.9 g/dL (ref 6.0–8.3)

## 2024-10-03 LAB — LIPID PANEL
Cholesterol: 203 mg/dL — ABNORMAL HIGH (ref 0–200)
HDL: 49.4 mg/dL (ref >39.00)
LDL Cholesterol: 141 mg/dL — ABNORMAL HIGH (ref 0–99)
NonHDL: 153.9
Total CHOL/HDL Ratio: 4
Triglycerides: 65 mg/dL (ref 0.0–149.0)
VLDL: 13 mg/dL (ref 0.0–40.0)

## 2024-10-03 LAB — MICROALBUMIN / CREATININE URINE RATIO
Creatinine,U: 60 mg/dL
Microalb Creat Ratio: UNDETERMINED mg/g (ref 0.0–30.0)
Microalb, Ur: <0.7 mg/dL

## 2024-10-03 MED ORDER — ROSUVASTATIN CALCIUM 20 MG PO TABS
20.0000 mg | ORAL_TABLET | Freq: Every day | ORAL | 3 refills | Status: AC
  Filled 2024-10-03: qty 90, 90d supply, fill #0

## 2024-10-03 MED ORDER — GLIPIZIDE ER 10 MG PO TB24
10.0000 mg | ORAL_TABLET | Freq: Every day | ORAL | 1 refills | Status: AC
Start: 2024-10-03 — End: ?
  Filled 2024-10-03 – 2024-11-13 (×2): qty 90, 90d supply, fill #0

## 2024-10-03 MED ORDER — LISINOPRIL 2.5 MG PO TABS
2.5000 mg | ORAL_TABLET | Freq: Every day | ORAL | 1 refills | Status: AC
  Filled 2024-10-03: qty 90, 90d supply, fill #0

## 2024-10-03 MED ORDER — OMEPRAZOLE 20 MG PO CPDR
20.0000 mg | DELAYED_RELEASE_CAPSULE | Freq: Every day | ORAL | 1 refills | Status: AC
  Filled 2024-10-03: qty 90, 90d supply, fill #0

## 2024-10-03 MED ORDER — AZELASTINE HCL 137 MCG/SPRAY NA SOLN
1.0000 | Freq: Two times a day (BID) | NASAL | 0 refills | Status: AC
  Filled 2024-10-03: qty 30, 30d supply, fill #0

## 2024-10-03 MED ORDER — TRIAMCINOLONE ACETONIDE 0.1 % EX CREA
TOPICAL_CREAM | Freq: Two times a day (BID) | CUTANEOUS | 0 refills | Status: AC
  Filled 2024-10-03: qty 454, 30d supply, fill #0

## 2024-10-03 NOTE — Patient Instructions (Signed)
 Go to lab Maintain Heart healthy diet and daily exercise. Maintain current medications.

## 2024-10-03 NOTE — Assessment & Plan Note (Signed)
 Stopped atorvastatin  due to SOB, had persistent symptoms with 2x/week dosing. Repeat lipid panel: LDL not at goal Start crestor 20mg  Advised about importance of mediterranean diet F/up in 3months

## 2024-10-03 NOTE — Assessment & Plan Note (Signed)
Stable with omeprazole daily ?Refill sent ?

## 2024-10-03 NOTE — Assessment & Plan Note (Signed)
 No improved with flonase Rebound congestion with afrin  Sent azelastine and entered referral to ent

## 2024-10-03 NOTE — Assessment & Plan Note (Signed)
 Unable to tolerate metformin  dose-nausea and diarrhea Current use of glipizide  10mg  daily Repeat hgbA1c at 7.2% She declined use of a glp-1ra or SGLT-2 at this time due to possible side effects She opted to make lifestyle modifications F/up in 3months
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
## 2024-10-06 ENCOUNTER — Encounter: Payer: Self-pay | Admitting: Radiology

## 2024-11-13 ENCOUNTER — Other Ambulatory Visit (HOSPITAL_COMMUNITY): Payer: Self-pay

## 2024-11-14 ENCOUNTER — Other Ambulatory Visit (HOSPITAL_COMMUNITY): Payer: Self-pay

## 2024-11-14 ENCOUNTER — Encounter (INDEPENDENT_AMBULATORY_CARE_PROVIDER_SITE_OTHER): Payer: Self-pay | Admitting: Physician Assistant

## 2024-11-14 ENCOUNTER — Ambulatory Visit (INDEPENDENT_AMBULATORY_CARE_PROVIDER_SITE_OTHER): Admitting: Physician Assistant

## 2024-11-14 VITALS — BP 121/81 | HR 84 | Ht 70.0 in | Wt 214.0 lb

## 2024-11-14 DIAGNOSIS — H6121 Impacted cerumen, right ear: Secondary | ICD-10-CM

## 2024-11-14 DIAGNOSIS — J309 Allergic rhinitis, unspecified: Secondary | ICD-10-CM | POA: Diagnosis not present

## 2024-11-14 DIAGNOSIS — J320 Chronic maxillary sinusitis: Secondary | ICD-10-CM

## 2024-11-14 DIAGNOSIS — H6122 Impacted cerumen, left ear: Secondary | ICD-10-CM

## 2024-11-14 DIAGNOSIS — J302 Other seasonal allergic rhinitis: Secondary | ICD-10-CM

## 2024-11-14 DIAGNOSIS — J310 Chronic rhinitis: Secondary | ICD-10-CM

## 2024-11-14 MED ORDER — FLUTICASONE PROPIONATE 50 MCG/ACT NA SUSP
2.0000 | Freq: Every day | NASAL | 6 refills | Status: AC
  Filled 2024-11-14: qty 16, 30d supply, fill #0

## 2024-11-14 MED ORDER — AMOXICILLIN-POT CLAVULANATE 875-125 MG PO TABS
1.0000 | ORAL_TABLET | Freq: Two times a day (BID) | ORAL | 0 refills | Status: AC
  Filled 2024-11-14: qty 14, 7d supply, fill #0

## 2024-11-14 MED ORDER — METHYLPREDNISOLONE 4 MG PO TBPK
ORAL_TABLET | ORAL | 1 refills | Status: AC
  Filled 2024-11-14: qty 21, 6d supply, fill #0

## 2024-11-14 NOTE — Patient Instructions (Signed)
 To help with your buildup of earwax try using sweet oil. This can be purchased over the counter at your local pharmacy. Using a sterilized ear dropper, place a few drops into your ear. Cover the ear with a cotton ball, or warm compress, for 5 to 10 minutes. Rub gently. Wipe out any excess ear wax, and the oil, with a cotton ball or a wet cloth.
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
## 2024-12-08 ENCOUNTER — Other Ambulatory Visit (HOSPITAL_COMMUNITY): Payer: Self-pay

## 2024-12-08 MED ORDER — CLOBETASOL PROPIONATE 0.05 % EX OINT
1.0000 | TOPICAL_OINTMENT | Freq: Two times a day (BID) | CUTANEOUS | 0 refills | Status: DC
  Filled 2024-12-08: qty 15, 8d supply, fill #0

## 2024-12-24 ENCOUNTER — Other Ambulatory Visit (HOSPITAL_COMMUNITY): Payer: Self-pay

## 2024-12-24 MED ORDER — PREDNISONE 10 MG PO TABS
10.0000 mg | ORAL_TABLET | Freq: Every day | ORAL | 0 refills | Status: AC
  Filled 2024-12-24: qty 3, 3d supply, fill #0

## 2024-12-25 ENCOUNTER — Other Ambulatory Visit (HOSPITAL_COMMUNITY): Payer: Self-pay

## 2024-12-25 MED ORDER — CLOBETASOL PROPIONATE 0.05 % EX OINT
1.0000 | TOPICAL_OINTMENT | Freq: Two times a day (BID) | CUTANEOUS | 0 refills | Status: AC
  Filled 2024-12-25: qty 15, 30d supply, fill #0

## 2025-01-01 ENCOUNTER — Other Ambulatory Visit (HOSPITAL_COMMUNITY): Payer: Self-pay

## 2025-01-01 MED ORDER — DESOXIMETASONE 0.05 % EX CREA
1.0000 | TOPICAL_CREAM | Freq: Two times a day (BID) | CUTANEOUS | 0 refills | Status: AC
  Filled 2025-01-01: qty 60, 90d supply, fill #0

## 2025-01-02 ENCOUNTER — Other Ambulatory Visit (HOSPITAL_COMMUNITY): Payer: Self-pay
